# Patient Record
Sex: Female | Born: 1935 | Race: White | Hispanic: No | Marital: Married | Smoking: Never smoker
Health system: Southern US, Community
[De-identification: ages and names within clinical notes are randomized; demographics above are authoritative.]

## PROBLEM LIST (undated history)

## (undated) DIAGNOSIS — E785 Hyperlipidemia, unspecified: Secondary | ICD-10-CM

## (undated) DIAGNOSIS — S52501A Unspecified fracture of the lower end of right radius, initial encounter for closed fracture: Secondary | ICD-10-CM

## (undated) DIAGNOSIS — I639 Cerebral infarction, unspecified: Secondary | ICD-10-CM

## (undated) HISTORY — PX: ABDOMINAL HYSTERECTOMY: SHX81

## (undated) HISTORY — DX: Cerebral infarction, unspecified: I63.9

## (undated) HISTORY — PX: TONSILLECTOMY: SUR1361

## (undated) HISTORY — PX: COLONOSCOPY: SHX174

---

## 2001-10-25 ENCOUNTER — Other Ambulatory Visit: Admission: RE | Admit: 2001-10-25 | Discharge: 2001-10-25 | Payer: Self-pay | Admitting: Obstetrics and Gynecology

## 2002-10-26 ENCOUNTER — Other Ambulatory Visit: Admission: RE | Admit: 2002-10-26 | Discharge: 2002-10-26 | Payer: Self-pay | Admitting: Obstetrics and Gynecology

## 2003-10-30 ENCOUNTER — Other Ambulatory Visit: Admission: RE | Admit: 2003-10-30 | Discharge: 2003-10-30 | Payer: Self-pay | Admitting: Obstetrics and Gynecology

## 2004-11-14 ENCOUNTER — Other Ambulatory Visit: Admission: RE | Admit: 2004-11-14 | Discharge: 2004-11-14 | Payer: Self-pay | Admitting: Obstetrics and Gynecology

## 2006-01-18 ENCOUNTER — Other Ambulatory Visit: Admission: RE | Admit: 2006-01-18 | Discharge: 2006-01-18 | Payer: Self-pay | Admitting: Obstetrics and Gynecology

## 2007-01-25 ENCOUNTER — Other Ambulatory Visit: Admission: RE | Admit: 2007-01-25 | Discharge: 2007-01-25 | Payer: Self-pay | Admitting: Obstetrics and Gynecology

## 2007-06-06 ENCOUNTER — Other Ambulatory Visit: Admission: RE | Admit: 2007-06-06 | Discharge: 2007-06-06 | Payer: Self-pay | Admitting: Obstetrics and Gynecology

## 2009-04-25 ENCOUNTER — Encounter: Admission: RE | Admit: 2009-04-25 | Discharge: 2009-04-25 | Payer: Self-pay | Admitting: Obstetrics and Gynecology

## 2011-12-02 ENCOUNTER — Other Ambulatory Visit: Payer: Self-pay | Admitting: Family Medicine

## 2011-12-02 DIAGNOSIS — Z780 Asymptomatic menopausal state: Secondary | ICD-10-CM

## 2011-12-08 ENCOUNTER — Ambulatory Visit
Admission: RE | Admit: 2011-12-08 | Discharge: 2011-12-08 | Disposition: A | Discharge status: Home / Self Care | Payer: Medicare Other | Source: Ambulatory Visit | Attending: Family Medicine | Admitting: Family Medicine

## 2011-12-08 DIAGNOSIS — Z780 Asymptomatic menopausal state: Secondary | ICD-10-CM

## 2013-02-21 ENCOUNTER — Encounter: Payer: Self-pay | Admitting: *Deleted

## 2013-02-22 ENCOUNTER — Ambulatory Visit (INDEPENDENT_AMBULATORY_CARE_PROVIDER_SITE_OTHER): Payer: Medicare Other | Admitting: *Deleted

## 2013-02-22 DIAGNOSIS — I781 Nevus, non-neoplastic: Secondary | ICD-10-CM

## 2013-02-22 NOTE — Progress Notes (Signed)
X=.3% Sotradecol administered with a 27g butterfly.  Patient received a total of 10cc.  Treated all areas of concern. Easy access and tol well. Will follow prn.  Photos: yes  Compression stockings applied: yes

## 2013-02-23 ENCOUNTER — Encounter: Payer: Self-pay | Admitting: *Deleted

## 2016-01-22 ENCOUNTER — Other Ambulatory Visit: Payer: Self-pay | Admitting: Obstetrics & Gynecology

## 2016-01-22 DIAGNOSIS — E2839 Other primary ovarian failure: Secondary | ICD-10-CM

## 2016-02-06 ENCOUNTER — Ambulatory Visit
Admission: RE | Admit: 2016-02-06 | Discharge: 2016-02-06 | Disposition: A | Discharge status: Home / Self Care | Payer: Medicare Other | Source: Ambulatory Visit | Attending: Obstetrics & Gynecology | Admitting: Obstetrics & Gynecology

## 2016-02-06 DIAGNOSIS — E2839 Other primary ovarian failure: Secondary | ICD-10-CM

## 2016-03-03 ENCOUNTER — Other Ambulatory Visit: Payer: Self-pay | Admitting: Obstetrics & Gynecology

## 2019-06-12 ENCOUNTER — Other Ambulatory Visit: Payer: Self-pay | Admitting: Surgery

## 2019-06-12 DIAGNOSIS — R1011 Right upper quadrant pain: Secondary | ICD-10-CM

## 2021-01-18 ENCOUNTER — Other Ambulatory Visit: Payer: Self-pay

## 2021-01-18 ENCOUNTER — Encounter (HOSPITAL_COMMUNITY): Payer: Self-pay

## 2021-01-18 ENCOUNTER — Emergency Department (HOSPITAL_COMMUNITY): Payer: Medicare Other

## 2021-01-18 ENCOUNTER — Emergency Department (HOSPITAL_COMMUNITY)
Admission: EM | Admit: 2021-01-18 | Discharge: 2021-01-18 | Disposition: A | Discharge status: Home / Self Care | Payer: Medicare Other | Attending: Emergency Medicine | Admitting: Emergency Medicine

## 2021-01-18 DIAGNOSIS — S52501A Unspecified fracture of the lower end of right radius, initial encounter for closed fracture: Secondary | ICD-10-CM | POA: Diagnosis not present

## 2021-01-18 DIAGNOSIS — S6991XA Unspecified injury of right wrist, hand and finger(s), initial encounter: Secondary | ICD-10-CM | POA: Diagnosis present

## 2021-01-18 DIAGNOSIS — Y9367 Activity, basketball: Secondary | ICD-10-CM | POA: Insufficient documentation

## 2021-01-18 DIAGNOSIS — W010XXA Fall on same level from slipping, tripping and stumbling without subsequent striking against object, initial encounter: Secondary | ICD-10-CM | POA: Diagnosis not present

## 2021-01-18 DIAGNOSIS — Y92310 Basketball court as the place of occurrence of the external cause: Secondary | ICD-10-CM | POA: Insufficient documentation

## 2021-01-18 DIAGNOSIS — T148XXA Other injury of unspecified body region, initial encounter: Secondary | ICD-10-CM

## 2021-01-18 MED ORDER — LIDOCAINE HCL (PF) 1 % IJ SOLN
30.0000 mL | Freq: Once | INTRAMUSCULAR | Status: AC
  Administered 2021-01-18: 30 mL
  Filled 2021-01-18: qty 30

## 2021-01-18 MED ORDER — ACETAMINOPHEN-CODEINE #3 300-30 MG PO TABS: 1.0000 | ORAL_TABLET | Freq: Four times a day (QID) | ORAL | 0 refills | Status: AC | PRN

## 2021-01-18 MED ORDER — ACETAMINOPHEN-CODEINE #3 300-30 MG PO TABS
1.0000 | ORAL_TABLET | Freq: Once | ORAL | Status: AC
  Administered 2021-01-18: 1 via ORAL
  Filled 2021-01-18: qty 1

## 2021-01-18 NOTE — Discharge Instructions (Addendum)
I have prescribed medication to help treat the pain for your distal fracture.  Please be aware this medication can cause drowsiness, do not drink alcohol or drive while taking this medication.  Dr. Hinda Glatter phone number is attached to your chart, please schedule an appointment in order to be seen in office of this upcoming week.

## 2021-01-18 NOTE — ED Triage Notes (Signed)
Pt reports falling within the last hour. Pt now endorses right wrist pain. Pt denies hitting her head and taking blood thinners.

## 2021-01-18 NOTE — Progress Notes (Signed)
Orthopedic Tech Progress Note Patient Details:  Katrina Rios 1935/11/06 [id number] Volar was placed after reduction followed up by a sugartong splint and a sling  Ortho Devices Type of Ortho Device: Volar splint,Sugartong splint,Arm sling Ortho Device/Splint Location: Right Arm Ortho Device/Splint Interventions: Application   Post Interventions Patient Tolerated: Well Instructions Provided: Care of device   [name] [name] 01/18/2021, 6:15 PM

## 2021-01-18 NOTE — ED Provider Notes (Signed)
[facility]-EMERGENCY DEPT Provider Note   CSN: [id number] Arrival date & time: 01/18/21  1518     History Chief Complaint  Patient presents with  . Wrist Pain    Katrina Rios is a 85 y.o. female.  85 y.o female with no PMH presents to the ED with a chief complaint of right wrist pain status post mechanical fall.  Patient reports she was playing basketball with her grandson when she suddenly tripped on her own feet, landing on the lateral aspect of her right wrist.  Reports pain to the area, has been unable to fully range her wrist since.  Has taken some ibuprofen for pain control without much improvement.  She is currently not on any blood thinners.  Obvious deformity noted to the right wrist.  No other injury or complaint on today's visit.  The history is provided by the patient.       History reviewed. No pertinent past medical history.  There are no problems to display for this patient.   History reviewed. No pertinent surgical history.   OB History   No obstetric history on file.     History reviewed. No pertinent family history.     Home Medications Prior to Admission medications   Medication Sig Start Date End Date Taking? Authorizing Provider  acetaminophen-codeine (TYLENOL #3) 300-30 MG tablet Take 1 tablet by mouth every 6 (six) hours as needed for up to 3 days for moderate pain. 01/18/21 01/21/21 Yes Lowana Hable, Leonie Douglas, PA-C    Allergies    Cephalosporins  Review of Systems   Review of Systems  Constitutional: Negative for fever.  Musculoskeletal: Positive for arthralgias and myalgias.    Physical Exam Updated Vital Signs BP (!) 137/100 (BP Location: Left Arm)   Pulse 74   Temp 98 F (36.7 C) (Oral)   Resp 16   Ht 5\' 8"  (1.727 m)   Wt 57.6 kg   SpO2 100%   BMI 19.31 kg/m   Physical Exam Vitals and nursing note reviewed.  Constitutional:      Appearance: Normal appearance.  HENT:     Head: Normocephalic and atraumatic.      Nose: Nose normal.  Eyes:     Pupils: Pupils are equal, round, and reactive to light.  Cardiovascular:     Rate and Rhythm: Normal rate.  Pulmonary:     Effort: Pulmonary effort is normal.  Musculoskeletal:        General: Tenderness and deformity present.     Right wrist: Swelling, deformity, tenderness and bony tenderness present. No effusion, lacerations, snuff box tenderness or crepitus. Decreased range of motion. Normal pulse.     Cervical back: Normal range of motion and neck supple.     Comments: Obvious deformity.  Pulses present, capillary refill is intact. Limited ROM with flexion or extension.   Skin:    General: Skin is warm and dry.  Neurological:     Mental Status: She is alert and oriented to person, place, and time.     ED Results / Procedures / Treatments   Labs (all labs ordered are listed, but only abnormal results are displayed) Labs Reviewed - No data to display  EKG None  Radiology DG Wrist 2 Views Right  Result Date: 01/18/2021 CLINICAL DATA:  Status post reduction of right wrist fracture. EXAM: RIGHT WRIST - 2 VIEW COMPARISON:  Same day radiograph. FINDINGS: Splinting material obscures fine bony and soft tissue detail. Interval reduction of the comminuted fracture  of the distal radius with intra-articular extension, now with decreased impaction and posterior angulation of the distal fracture fragment. IMPRESSION: Status post reduction and splinting with slightly improved alignment of the comminuted impacted fracture of the distal radius. Electronically Signed   By: Maudry Mayhew MD   On: 01/18/2021 18:13   DG Wrist Complete Right  Result Date: 01/18/2021 CLINICAL DATA:  Fall while playing basketball with wrist pain. EXAM: RIGHT WRIST - COMPLETE 3+ VIEW COMPARISON:  None. FINDINGS: There is a comminuted, impacted fracture of the distal radius which involves the radiocarpal joint space. Degenerative changes are seen at the first carpal metacarpal joint.  There is surrounding soft tissue swelling. No radiopaque foreign body is identified. IMPRESSION: Comminuted, impacted fracture of the distal radius. Electronically Signed   By: Romona Curls M.D.   On: 01/18/2021 16:13    Procedures .Ortho Injury Treatment  Date/Time: 01/18/2021 6:08 PM Performed by: Claude Manges, PA-C Authorized by: Claude Manges, PA-C   Consent:    Consent obtained:  Verbal   Consent given by:  Patient   Risks discussed:  Fracture Universal protocol:    Patient identity confirmed:  Verbally with patientInjury location: wrist Location details: right wrist Injury type: fracture Fracture type: distal radius Pre-procedure neurovascular assessment: neurovascularly intact Anesthesia: hematoma block  Anesthesia: Local anesthesia used: yes Local Anesthetic: lidocaine 1% without epinephrine Anesthetic total: 5 mL  Patient sedated: NoImmobilization: splint Splint type: volar short arm and sugar tong Splint Applied by: ED Tech Supplies used: cotton padding Post-procedure neurovascular assessment: post-procedure neurovascularly intact      Medications Ordered in ED Medications  acetaminophen-codeine (TYLENOL #3) 300-30 MG per tablet 1 tablet (1 tablet Oral Given 01/18/21 1608)  lidocaine (PF) (XYLOCAINE) 1 % injection 30 mL (30 mLs Infiltration Given by Other 01/18/21 1811)    ED Course  I have reviewed the triage vital signs and the nursing notes.  Pertinent labs & imaging results that were available during my care of the patient were reviewed by me and considered in my medical decision making (see chart for details).  Clinical Course as of 01/18/21 1820  Sat Jan 18, 2021  1734 DG Wrist Complete Right [AW]    Clinical Course User Index [AW] Koleen Distance, MD   MDM Rules/Calculators/A&P  Patient presents to the ED status post mechanical fall after playing basketball with her grandson.  Obvious deformity noted to the right wrist.  Limited range of motion  with flexion and extension of the right wrist.  Arrived with stable vital signs.  Took ibuprofen for pain control without much improvement.  Currently not on any blood thinners.  During evaluation pulses are present, capillary refill is intact, obvious deformity noted, bony tenderness at wrist region. Xray ordered.   Xray of the right wrist showed: Comminuted, impacted fracture of the distal radius.  4:26 PM Call placed to Dr. Roney Mans, hand specialist for further recommendations.   4:56 PM spoke to Dr. Roney Mans hand specialist, who recommended tong splint along with outpatient follow-up the upcoming week  Hematoma block was performed by me, Dr. Delford Field performed the reduction at the bedside.  Splint was applied by [employer].  She is neurovascularly intact, we discussed follow-up with hand specialist the upcoming week.  Post reduction x-ray showed: Status post reduction and splinting with slightly improved alignment  of the comminuted impacted fracture of the distal radius.   This was discussed with patient, she was provided with tylenol 3 for pain control. Will need to follow up  with orthopedist outpatient. Patient stable for discharge.    Portions of this note were generated with Scientist, clinical (histocompatibility and immunogenetics). Dictation errors may occur despite best attempts at proofreading.  Final Clinical Impression(s) / ED Diagnoses Final diagnoses:  Closed fracture of distal end of right radius, unspecified fracture morphology, initial encounter    Rx / DC Orders ED Discharge Orders         Ordered    acetaminophen-codeine (TYLENOL #3) 300-30 MG tablet  Every 6 hours PRN        01/18/21 1807           Claude Manges, PA-C 01/18/21 1820    Koleen Distance, MD 01/18/21 2026

## 2021-01-21 ENCOUNTER — Encounter (HOSPITAL_BASED_OUTPATIENT_CLINIC_OR_DEPARTMENT_OTHER): Payer: Self-pay | Admitting: Orthopaedic Surgery

## 2021-01-21 ENCOUNTER — Other Ambulatory Visit: Payer: Self-pay

## 2021-01-23 ENCOUNTER — Other Ambulatory Visit (HOSPITAL_COMMUNITY): Payer: Medicare Other

## 2021-01-24 ENCOUNTER — Encounter (HOSPITAL_BASED_OUTPATIENT_CLINIC_OR_DEPARTMENT_OTHER)
Admission: RE | Admit: 2021-01-24 | Discharge: 2021-01-24 | Disposition: A | Discharge status: Home / Self Care | Payer: Medicare Other | Source: Ambulatory Visit | Attending: Orthopaedic Surgery | Admitting: Orthopaedic Surgery

## 2021-01-24 DIAGNOSIS — S52571A Other intraarticular fracture of lower end of right radius, initial encounter for closed fracture: Secondary | ICD-10-CM | POA: Diagnosis not present

## 2021-01-24 DIAGNOSIS — W010XXA Fall on same level from slipping, tripping and stumbling without subsequent striking against object, initial encounter: Secondary | ICD-10-CM | POA: Diagnosis not present

## 2021-01-24 DIAGNOSIS — Z20822 Contact with and (suspected) exposure to covid-19: Secondary | ICD-10-CM | POA: Diagnosis not present

## 2021-01-24 DIAGNOSIS — Z79890 Hormone replacement therapy (postmenopausal): Secondary | ICD-10-CM | POA: Diagnosis not present

## 2021-01-24 DIAGNOSIS — Z881 Allergy status to other antibiotic agents status: Secondary | ICD-10-CM | POA: Diagnosis not present

## 2021-01-24 DIAGNOSIS — Y9367 Activity, basketball: Secondary | ICD-10-CM | POA: Diagnosis not present

## 2021-01-24 LAB — SARS CORONAVIRUS 2 (TAT 6-24 HRS): SARS Coronavirus 2: NEGATIVE

## 2021-01-24 LAB — SURGICAL PCR SCREEN
MRSA, PCR: NEGATIVE
Staphylococcus aureus: NEGATIVE

## 2021-01-24 NOTE — Progress Notes (Signed)

## 2021-01-27 ENCOUNTER — Ambulatory Visit (HOSPITAL_BASED_OUTPATIENT_CLINIC_OR_DEPARTMENT_OTHER)
Admission: RE | Admit: 2021-01-27 | Discharge: 2021-01-27 | Disposition: A | Discharge status: Home / Self Care | Payer: Medicare Other | Attending: Orthopaedic Surgery | Admitting: Orthopaedic Surgery

## 2021-01-27 ENCOUNTER — Encounter (HOSPITAL_BASED_OUTPATIENT_CLINIC_OR_DEPARTMENT_OTHER): Payer: Self-pay | Admitting: Orthopaedic Surgery

## 2021-01-27 ENCOUNTER — Ambulatory Visit (HOSPITAL_BASED_OUTPATIENT_CLINIC_OR_DEPARTMENT_OTHER): Payer: Medicare Other | Admitting: Anesthesiology

## 2021-01-27 ENCOUNTER — Encounter (HOSPITAL_BASED_OUTPATIENT_CLINIC_OR_DEPARTMENT_OTHER)
Admission: RE | Disposition: A | Discharge status: Home / Self Care | Payer: Self-pay | Source: Home / Self Care | Attending: Orthopaedic Surgery

## 2021-01-27 ENCOUNTER — Other Ambulatory Visit: Payer: Self-pay

## 2021-01-27 DIAGNOSIS — Z881 Allergy status to other antibiotic agents status: Secondary | ICD-10-CM | POA: Insufficient documentation

## 2021-01-27 DIAGNOSIS — W010XXA Fall on same level from slipping, tripping and stumbling without subsequent striking against object, initial encounter: Secondary | ICD-10-CM | POA: Insufficient documentation

## 2021-01-27 DIAGNOSIS — S52571A Other intraarticular fracture of lower end of right radius, initial encounter for closed fracture: Secondary | ICD-10-CM | POA: Diagnosis not present

## 2021-01-27 DIAGNOSIS — Y9367 Activity, basketball: Secondary | ICD-10-CM | POA: Insufficient documentation

## 2021-01-27 DIAGNOSIS — Z79890 Hormone replacement therapy (postmenopausal): Secondary | ICD-10-CM | POA: Insufficient documentation

## 2021-01-27 DIAGNOSIS — Z20822 Contact with and (suspected) exposure to covid-19: Secondary | ICD-10-CM | POA: Diagnosis not present

## 2021-01-27 HISTORY — PX: OPEN REDUCTION INTERNAL FIXATION (ORIF) DISTAL RADIAL FRACTURE: SHX5989

## 2021-01-27 HISTORY — DX: Unspecified fracture of the lower end of right radius, initial encounter for closed fracture: S52.501A

## 2021-01-27 SURGERY — OPEN REDUCTION INTERNAL FIXATION (ORIF) DISTAL RADIUS FRACTURE
Anesthesia: Monitor Anesthesia Care | Site: Wrist | Laterality: Right

## 2021-01-27 MED ORDER — VANCOMYCIN HCL IN DEXTROSE 1-5 GM/200ML-% IV SOLN
1000.0000 mg | INTRAVENOUS | Status: AC
  Administered 2021-01-27: 1000 mg via INTRAVENOUS

## 2021-01-27 MED ORDER — ONDANSETRON HCL 4 MG/2ML IJ SOLN
INTRAMUSCULAR | Status: AC
  Filled 2021-01-27: qty 2

## 2021-01-27 MED ORDER — HYDROCODONE-ACETAMINOPHEN 5-325 MG PO TABS: 1.0000 | ORAL_TABLET | Freq: Four times a day (QID) | ORAL | 0 refills | Status: AC | PRN

## 2021-01-27 MED ORDER — ONDANSETRON HCL 4 MG/2ML IJ SOLN
INTRAMUSCULAR | Status: DC | PRN
  Administered 2021-01-27: 4 mg via INTRAVENOUS

## 2021-01-27 MED ORDER — VANCOMYCIN HCL IN DEXTROSE 1-5 GM/200ML-% IV SOLN
INTRAVENOUS | Status: AC
  Filled 2021-01-27: qty 200

## 2021-01-27 MED ORDER — ROPIVACAINE HCL 5 MG/ML IJ SOLN
INTRAMUSCULAR | Status: DC | PRN
  Administered 2021-01-27: 20 mL via PERINEURAL

## 2021-01-27 MED ORDER — ACETAMINOPHEN 500 MG PO TABS
ORAL_TABLET | ORAL | Status: AC
  Filled 2021-01-27: qty 2

## 2021-01-27 MED ORDER — DEXAMETHASONE SODIUM PHOSPHATE 10 MG/ML IJ SOLN
INTRAMUSCULAR | Status: DC | PRN
  Administered 2021-01-27: 5 mg

## 2021-01-27 MED ORDER — FENTANYL CITRATE (PF) 100 MCG/2ML IJ SOLN
100.0000 ug | Freq: Once | INTRAMUSCULAR | Status: AC
  Administered 2021-01-27: 100 ug via INTRAVENOUS

## 2021-01-27 MED ORDER — PROPOFOL 10 MG/ML IV BOLUS
INTRAVENOUS | Status: AC
  Filled 2021-01-27: qty 20

## 2021-01-27 MED ORDER — FENTANYL CITRATE (PF) 100 MCG/2ML IJ SOLN
INTRAMUSCULAR | Status: AC
  Filled 2021-01-27: qty 2

## 2021-01-27 MED ORDER — ACETAMINOPHEN 500 MG PO TABS
1000.0000 mg | ORAL_TABLET | Freq: Once | ORAL | Status: AC
  Administered 2021-01-27: 1000 mg via ORAL

## 2021-01-27 MED ORDER — CHLORHEXIDINE GLUCONATE 4 % EX LIQD: 60.0000 mL | Freq: Once | CUTANEOUS | Status: DC

## 2021-01-27 MED ORDER — PROPOFOL 500 MG/50ML IV EMUL
INTRAVENOUS | Status: DC | PRN
  Administered 2021-01-27: 75 ug/kg/min via INTRAVENOUS

## 2021-01-27 MED ORDER — FENTANYL CITRATE (PF) 100 MCG/2ML IJ SOLN: 25.0000 ug | INTRAMUSCULAR | Status: DC | PRN

## 2021-01-27 MED ORDER — LACTATED RINGERS IV SOLN: INTRAVENOUS | Status: DC

## 2021-01-27 MED ORDER — POVIDONE-IODINE 10 % EX SWAB
2.0000 "application " | Freq: Once | CUTANEOUS | Status: AC
  Administered 2021-01-27: 2 via TOPICAL

## 2021-01-27 MED ORDER — PROPOFOL 500 MG/50ML IV EMUL
INTRAVENOUS | Status: AC
  Filled 2021-01-27: qty 50

## 2021-01-27 MED ORDER — MIDAZOLAM HCL 2 MG/2ML IJ SOLN
INTRAMUSCULAR | Status: AC
  Filled 2021-01-27: qty 2

## 2021-01-27 SURGICAL SUPPLY — 75 items
APL PRP STRL LF DISP 70% ISPRP (MISCELLANEOUS) ×1
BIT DRILL 2.0 LNG QUCK RELEASE (BIT) IMPLANT
BIT DRILL QC 2.8X5 (BIT) ×1 IMPLANT
BLADE SURG 15 STRL LF DISP TIS (BLADE) ×2 IMPLANT
BLADE SURG 15 STRL SS (BLADE) ×4
BNDG CMPR 9X4 STRL LF SNTH (GAUZE/BANDAGES/DRESSINGS) ×1
BNDG ELASTIC 3X5.8 VLCR STR LF (GAUZE/BANDAGES/DRESSINGS) ×2 IMPLANT
BNDG ELASTIC 4X5.8 VLCR STR LF (GAUZE/BANDAGES/DRESSINGS) ×2 IMPLANT
BNDG ESMARK 4X9 LF (GAUZE/BANDAGES/DRESSINGS) ×2 IMPLANT
BNDG GAUZE ELAST 4 BULKY (GAUZE/BANDAGES/DRESSINGS) ×2 IMPLANT
CANISTER SUCT 1200ML W/VALVE (MISCELLANEOUS) ×2 IMPLANT
CHLORAPREP W/TINT 26 (MISCELLANEOUS) ×2 IMPLANT
CORD BIPOLAR FORCEPS 12FT (ELECTRODE) ×2 IMPLANT
COVER BACK TABLE 60X90IN (DRAPES) ×2 IMPLANT
CUFF TOURN SGL QUICK 18X4 (TOURNIQUET CUFF) ×1 IMPLANT
DRAPE EXTREMITY T 121X128X90 (DISPOSABLE) ×2 IMPLANT
DRAPE OEC MINIVIEW 54X84 (DRAPES) ×2 IMPLANT
DRAPE SURG 17X23 STRL (DRAPES) ×2 IMPLANT
DRILL 2.0 LNG QUICK RELEASE (BIT) ×2
GAUZE SPONGE 4X4 12PLY STRL (GAUZE/BANDAGES/DRESSINGS) ×2 IMPLANT
GAUZE XEROFORM 1X8 LF (GAUZE/BANDAGES/DRESSINGS) ×1 IMPLANT
GLOVE SRG 8 PF TXTR STRL LF DI (GLOVE) ×1 IMPLANT
GLOVE SURG ENC MOIS LTX SZ6.5 (GLOVE) ×2 IMPLANT
GLOVE SURG SYN 7.5  E (GLOVE) ×2
GLOVE SURG SYN 7.5 E (GLOVE) ×1 IMPLANT
GLOVE SURG SYN 7.5 PF PI (GLOVE) IMPLANT
GLOVE SURG UNDER POLY LF SZ7 (GLOVE) ×2 IMPLANT
GLOVE SURG UNDER POLY LF SZ8 (GLOVE) ×2
GOWN STRL REUS W/ TWL LRG LVL3 (GOWN DISPOSABLE) ×1 IMPLANT
GOWN STRL REUS W/ TWL XL LVL3 (GOWN DISPOSABLE) ×1 IMPLANT
GOWN STRL REUS W/TWL LRG LVL3 (GOWN DISPOSABLE) ×2
GOWN STRL REUS W/TWL XL LVL3 (GOWN DISPOSABLE) ×2
GUIDEWIRE ORTHO 0.054X6 (WIRE) ×4 IMPLANT
NS IRRIG 1000ML POUR BTL (IV SOLUTION) ×2 IMPLANT
PACK BASIN DAY SURGERY FS (CUSTOM PROCEDURE TRAY) ×2 IMPLANT
PAD CAST 3X4 CTTN HI CHSV (CAST SUPPLIES) ×2 IMPLANT
PAD CAST 4YDX4 CTTN HI CHSV (CAST SUPPLIES) ×1 IMPLANT
PADDING CAST ABS 3INX4YD NS (CAST SUPPLIES) ×1
PADDING CAST ABS 4INX4YD NS (CAST SUPPLIES) ×1
PADDING CAST ABS COTTON 3X4 (CAST SUPPLIES) ×1 IMPLANT
PADDING CAST ABS COTTON 4X4 ST (CAST SUPPLIES) ×1 IMPLANT
PADDING CAST COTTON 3X4 STRL (CAST SUPPLIES) ×4
PADDING CAST COTTON 4X4 STRL (CAST SUPPLIES) ×2
PASSER SUT SWANSON 36MM LOOP (INSTRUMENTS) ×1 IMPLANT
PLATE ACULOC 2 NARROW LONG (Plate) ×1 IMPLANT
SCREW BN FT 16X2.3XLCK HEX CRT (Screw) IMPLANT
SCREW CORT FT 18X2.3XLCK HEX (Screw) IMPLANT
SCREW CORT FT 22X2.3XLCK HEX (Screw) IMPLANT
SCREW CORTICAL LOCKING 2.3X16M (Screw) ×2 IMPLANT
SCREW CORTICAL LOCKING 2.3X18M (Screw) ×8 IMPLANT
SCREW CORTICAL LOCKING 2.3X22M (Screw) ×2 IMPLANT
SCREW HEXALOBE LOCKING 3.5X14M (Screw) ×1 IMPLANT
SCREW LOCK 12X3.5X HEXALOBE (Screw) IMPLANT
SCREW LOCKING 3.5X12 (Screw) ×2 IMPLANT
SCREW NONLOCK HEX 3.5X12 (Screw) ×1 IMPLANT
SHEET MEDIUM DRAPE 40X70 STRL (DRAPES) ×2 IMPLANT
SLING ARM FOAM STRAP LRG (SOFTGOODS) ×1 IMPLANT
SPLINT FIBERGLASS 3X35 (CAST SUPPLIES) ×1 IMPLANT
SPLINT FIBERGLASS 4X30 (CAST SUPPLIES) IMPLANT
SPLINT PLASTER CAST XFAST 3X15 (CAST SUPPLIES) IMPLANT
SPLINT PLASTER XTRA FASTSET 3X (CAST SUPPLIES)
STOCKINETTE SYNTHETIC 3 UNSTER (CAST SUPPLIES) ×2 IMPLANT
SUCTION FRAZIER HANDLE 10FR (MISCELLANEOUS) ×2
SUCTION TUBE FRAZIER 10FR DISP (MISCELLANEOUS) ×1 IMPLANT
SUT ETHIBOND 3-0 V-5 (SUTURE) IMPLANT
SUT FIBERWIRE 2-0 18 17.9 3/8 (SUTURE) ×2
SUT PROLENE 4 0 PS 2 18 (SUTURE) ×3 IMPLANT
SUT VIC AB 3-0 FS2 27 (SUTURE) ×1 IMPLANT
SUTURE FIBERWR 2-0 18 17.9 3/8 (SUTURE) IMPLANT
SYR BULB EAR ULCER 3OZ GRN STR (SYRINGE) ×2 IMPLANT
TAPE SURG TRANSPORE 1 IN (GAUZE/BANDAGES/DRESSINGS) ×1 IMPLANT
TAPE SURGICAL TRANSPORE 1 IN (GAUZE/BANDAGES/DRESSINGS) ×2
TOWEL GREEN STERILE FF (TOWEL DISPOSABLE) ×4 IMPLANT
TUBE CONNECTING 20X1/4 (TUBING) ×2 IMPLANT
UNDERPAD 30X36 HEAVY ABSORB (UNDERPADS AND DIAPERS) ×2 IMPLANT

## 2021-01-27 NOTE — Anesthesia Postprocedure Evaluation (Signed)
Anesthesia Post Note  Patient: TAMZIN BERTLING  Procedure(s) Performed: OPEN REDUCTION INTERNAL FIXATION (ORIF) DISTAL RADIAL FRACTURE (Right Wrist)     Patient location during evaluation: PACU Anesthesia Type: Regional and MAC Level of consciousness: awake and alert Pain management: pain level controlled Vital Signs Assessment: post-procedure vital signs reviewed and stable Respiratory status: spontaneous breathing, nonlabored ventilation, respiratory function stable and patient connected to nasal cannula oxygen Cardiovascular status: blood pressure returned to baseline and stable Postop Assessment: no apparent nausea or vomiting Anesthetic complications: no   No complications documented.  Last Vitals:  Vitals:   01/27/21 1245 01/27/21 1327  BP:  (!) 141/97  Pulse: 66 66  Resp: 20 16  Temp:  (!) 36.3 C  SpO2: 95% 99%    Last Pain:  Vitals:   01/27/21 1304  TempSrc:   PainSc: 0-No pain                 Ruth Tully L Adom Schoeneck

## 2021-01-27 NOTE — H&P (Signed)
ORTHOPAEDIC H&P  PCP:  Kaleen Mask, MD  Chief Complaint: Right wrist pain  HPI: Katrina Rios is a 85 y.o. female who complains of right wrist pain.  Last week she was playing basketball with her grandson.  She tripped and fell onto an outstretched right arm.  She immediate pain and deformity right wrist.  She was subsequently seen in the ER where she was found to have a displaced intra-articular right distal radius fracture.  She underwent a reduction in the ER and splint was placed.  She was then seen by me in clinic.  She still had significant dorsal tilt of the articular surface as well as dorsal comminution based on her current alignment and activity level, I did recommend proceeding forward with open reduction and internal fixation of her right distal radius fracture.  She presents today for operative management.  Past Medical History:  Diagnosis Date  . Closed fracture of right distal radius    Past Surgical History:  Procedure Laterality Date  . ABDOMINAL HYSTERECTOMY    . COLONOSCOPY    . TONSILLECTOMY     Social History   Socioeconomic History  . Marital status: Married    Spouse name: Not on file  . Number of children: Not on file  . Years of education: Not on file  . Highest education level: Not on file  Occupational History  . Not on file  Tobacco Use  . Smoking status: Never Smoker  . Smokeless tobacco: Never Used  Substance and Sexual Activity  . Alcohol use: Not Currently  . Drug use: Never  . Sexual activity: Not on file  Other Topics Concern  . Not on file  Social History Narrative  . Not on file   Social Determinants of Health   Financial Resource Strain: Not on file  Food Insecurity: Not on file  Transportation Needs: Not on file  Physical Activity: Not on file  Stress: Not on file  Social Connections: Not on file   History reviewed. No pertinent family history. Allergies  Allergen Reactions  . Cephalosporins Swelling and Rash    Prior to Admission medications   Medication Sig Start Date End Date Taking? Authorizing Provider  b complex vitamins capsule Take 1 capsule by mouth daily.   Yes [provider]  calcium carbonate (OS-CAL - DOSED IN MG OF ELEMENTAL CALCIUM) 1250 (500 Ca) MG tablet Take 1 tablet by mouth.   Yes [provider]  estradiol (ESTRACE) 1 MG tablet Take 1 mg by mouth daily.   Yes [provider]   No results found.  Positive ROS: All other systems have been reviewed and were otherwise negative with the exception of those mentioned in the HPI and as above.  Physical Exam: General: Alert, no acute distress Cardiovascular: No edema Respiratory: No cyanosis, no use of accessory musculature Skin: Fracture blisters seen on the digits but no signs of infection. Psychiatric: Patient is competent for consent with normal mood and affect  MUSCULOSKELETAL: Tenderness palpation around the right distal radius both palmarly and dorsally.  Intact motor to the AIN, PIN and ulnar nerve distributions.  Fingertips are warm well perfused with brisk capillary refill.  Intact sensation throughout all digits.  Assessment: Displaced intra-articular right distal radius fracture  Plan: Plan to proceed forward with open reduction and internal fixation of the right distal radius fracture.  Risk, benefits and alternatives of the procedure were once again discussed with the patient.  These risks include but  are not limited to infection, bleeding, damage to surrounding structures including blood vessels and nerves, pain, stiffness, malunion, nonunion, implant failure and need for additional procedures.  Informed consent was obtained the patient's right arm was marked.  Plan for discharge home postoperatively with follow-up with me in 10 to 14 days.    Ernest Mallick, MD [phone]   01/27/2021 9:20 AM

## 2021-01-27 NOTE — Progress Notes (Signed)
Assisted Dr. Woodrum with right, ultrasound guided, supraclavicular block. Side rails up, monitors on throughout procedure. See vital signs in flow sheet. Tolerated Procedure well. 

## 2021-01-27 NOTE — Transfer of Care (Signed)
Immediate Anesthesia Transfer of Care Note  Patient: Katrina Rios  Procedure(s) Performed: OPEN REDUCTION INTERNAL FIXATION (ORIF) DISTAL RADIAL FRACTURE (Right Wrist)  Patient Location: PACU  Anesthesia Type:MAC and Regional  Level of Consciousness: awake, alert  and oriented  Airway & Oxygen Therapy: Patient Spontanous Breathing and Patient connected to face mask oxygen  Post-op Assessment: Report given to RN and Post -op Vital signs reviewed and stable  Post vital signs: Reviewed and stable  Last Vitals:  Vitals Value Taken Time  BP    Temp    Pulse    Resp    SpO2      Last Pain:  Vitals:   01/27/21 0853  TempSrc: Oral  PainSc: 3       Patients Stated Pain Goal: 5 (16/60/63 0160)  Complications: No complications documented.

## 2021-01-27 NOTE — Anesthesia Preprocedure Evaluation (Signed)
Anesthesia Evaluation  Patient identified by MRN, date of birth, ID band Patient awake    Reviewed: Allergy & Precautions, NPO status , Patient's Chart, lab work & pertinent test results  Airway Mallampati: II  TM Distance: >3 FB Neck ROM: Full    Dental no notable dental hx. (+) Teeth Intact, Dental Advisory Given   Pulmonary neg pulmonary ROS,    Pulmonary exam normal breath sounds clear to auscultation       Cardiovascular negative cardio ROS Normal cardiovascular exam Rhythm:Regular Rate:Normal     Neuro/Psych negative neurological ROS  negative psych ROS   GI/Hepatic negative GI ROS, Neg liver ROS,   Endo/Other  negative endocrine ROS  Renal/GU negative Renal ROS  negative genitourinary   Musculoskeletal negative musculoskeletal ROS (+)   Abdominal   Peds  Hematology negative hematology ROS (+)   Anesthesia Other Findings   Reproductive/Obstetrics                             Anesthesia Physical Anesthesia Plan  ASA: I  Anesthesia Plan: MAC and Regional   Post-op Pain Management:  Regional for Post-op pain   Induction: Intravenous  PONV Risk Score and Plan: 2 and Propofol infusion, Treatment may vary due to age or medical condition, Ondansetron and Dexamethasone  Airway Management Planned: Natural Airway  Additional Equipment:   Intra-op Plan:   Post-operative Plan:   Informed Consent: I have reviewed the patients History and Physical, chart, labs and discussed the procedure including the risks, benefits and alternatives for the proposed anesthesia with the patient or authorized representative who has indicated his/her understanding and acceptance.     Dental advisory given  Plan Discussed with: CRNA  Anesthesia Plan Comments:         Anesthesia Quick Evaluation

## 2021-01-27 NOTE — Discharge Instructions (Signed)
Discharge Instructions  - Keep dressings in place. Do not remove them. - The dressings must stay dry - Take all medication as prescribed. Transition to over the counter pain medication as your pain improves - Keep the hand elevated over the next 48-72 hours to help with pain and swelling - Move all digits not restricted by the dressings regularly to prevent stiffness - Please call to schedule a follow up appointment with Dr. Roney Mans and therapy at [phone] for 10-14 days following surgery - Your pain medication have been sent digitally to your pharmacy  *No tylenol until 3 pm today 01/27/21 (You had 1000 mg at 9:00 AM)  Post Anesthesia Home Care Instructions  Activity: Get plenty of rest for the remainder of the day. A responsible individual must stay with you for 24 hours following the procedure.  For the next 24 hours, DO NOT: -Drive a car -Advertising copywriter -Drink alcoholic beverages -Take any medication unless instructed by your physician -Make any legal decisions or sign important papers.  Meals: Start with liquid foods such as gelatin or soup. Progress to regular foods as tolerated. Avoid greasy, spicy, heavy foods. If nausea and/or vomiting occur, drink only clear liquids until the nausea and/or vomiting subsides. Call your physician if vomiting continues.  Special Instructions/Symptoms: Your throat may feel dry or sore from the anesthesia or the breathing tube placed in your throat during surgery. If this causes discomfort, gargle with warm salt water. The discomfort should disappear within 24 hours.  If you had a scopolamine patch placed behind your ear for the management of post- operative nausea and/or vomiting:  1. The medication in the patch is effective for 72 hours, after which it should be removed.  Wrap patch in a tissue and discard in the trash. Wash hands thoroughly with soap and water. 2. You may remove the patch earlier than 72 hours if you experience  unpleasant side effects which may include dry mouth, dizziness or visual disturbances. 3. Avoid touching the patch. Wash your hands with soap and water after contact with the patch.    Regional Anesthesia Blocks  1. Numbness or the inability to move the "blocked" extremity may last from 3-48 hours after placement. The length of time depends on the medication injected and your individual response to the medication. If the numbness is not going away after 48 hours, call your surgeon.  2. The extremity that is blocked will need to be protected until the numbness is gone and the  Strength has returned. Because you cannot feel it, you will need to take extra care to avoid injury. Because it may be weak, you may have difficulty moving it or using it. You may not know what position it is in without looking at it while the block is in effect.  3. For blocks in the legs and feet, returning to weight bearing and walking needs to be done carefully. You will need to wait until the numbness is entirely gone and the strength has returned. You should be able to move your leg and foot normally before you try and bear weight or walk. You will need someone to be with you when you first try to ensure you do not fall and possibly risk injury.  4. Bruising and tenderness at the needle site are common side effects and will resolve in a few days.  5. Persistent numbness or new problems with movement should be communicated to the surgeon or the [facility] [phone] Katrina Rios  Katrina Rios [phone]).

## 2021-01-27 NOTE — Op Note (Signed)
PREOPERATIVE DIAGNOSIS: Comminuted and intra-articular, greater than 3 part right distal radius fracture  POSTOPERATIVE DIAGNOSIS: Same  ATTENDING PHYSICIAN: Maudry Mayhew. Jeannie Fend, III, MD who was present and scrubbed for the entire case   ASSISTANT SURGEON: None.   ANESTHESIA: Regional with MAC  SURGICAL PROCEDURES: 1. Open reduction and internal fixation of intra-articular, greater than 3 part distal radius fracture 2.  Right brachioradialis tenotomy  SURGICAL INDICATIONS: Patient is an 85 year old female who is very active.  About 1 week ago she tripped and fell onto an outstretched right hand while playing basketball with her grandson.  She had immediate pain and deformity of her right wrist and was seen at the ER where she was found to have a comminuted and displaced intra-articular right distal radius fracture.  She underwent a closed reduction and was sent to see me in clinic.  In clinic she still had a significant loss of articular height with persistent dorsal angulation of the articular surface.  Due to the patient's activity level and the current alignment, I did recommend proceeding forward with surgical fixation of her right distal radius fracture and she presents today for operative management.  FINDING: Multifragment did intra-articular right distal radius fracture with greater than 3 fragments.  Stable fixation was achieved using a [employer] volar locking plate and screw construct with a distally fitting, narrow with volar plate.  DESCRIPTION OF PROCEDURE: Patient was identified in the preoperative holding area where the risk benefits and alternatives of the procedure were once again discussed with the patient.  These risks include but are not limited to infection, bleeding, damage to surrounding structures including blood vessels and nerves, pain, stiffness, malunion, nonunion, loss reduction and need for additional procedures.  Informed consent was obtained at that time the patient's  right hand was marked with a surgical marking pen.  She then underwent a right upper extremity plexus block by anesthesia.  She was then brought to the operative suite where time was performed identify the correct patient operative site.  She was positioned supine on the operative table with her hand outstretched on a hand table.  A tourniquet was placed on the upper arm and preoperative antibiotics were administered.  The patient was then induced under MAC sedation.  The right upper extremity was then prepped and draped in usual sterile fashion.  The limb was exsanguinated and the tourniquet was inflated.  A standard volar FCR approach was made with a longitudinal incision over the FCR tendon.  The sheath over the FCR tendon was incised and the tendon was mobilized ulnarly.  The deeper FCR to tendon sheath was then incised exposing the FPL muscle and tendon.  This was bluntly mobilized ulnarly as well, exposing the pronator quadratus muscle.  This was incised and elevated off the volar distal radius using a wood handle elevator.  This revealed a multi fragmented, comminuted intra-articular right distal radius fracture.  Reduction of the fracture was then performed with mobilization of both the larger radial styloid and lunate fossa fragments.  Multiple K wires were placed to help hold provisional fixation.  In order to aid in mobilization of the radial styloid piece, the brachioradialis tendon was identified and scissors were used to incise the tendon.  This aided in additional mobilization of the radial styloid fragment.  With holding the fracture in a reduced position, K wires were placed through the radial styloid and across the fracture site to help hold provisional fixation.  At this point the narrow width, distally fitting volar  locking plate from [employer] was placed along the volar cortex of the distal radius.  A kickstand technique was utilized to help correct residual dorsal angulation.  While placing  clamps to help compress the plate down to the bone, the distal row of locking screws was placed through the plate in usual standard fashion.  The kickstand was then removed and the plate was reduced down to the volar cortex of the distal radius.  A cortical screw was then placed through the oblong hole in the plate proximal to the fracture site.  Fluoroscopic images were again obtained which showed significant provement in the alignment of the intra-articular fracture with near-anatomic alignment.  Locking screws were placed in the plate proximal to the fracture site.  The 2 radial styloid screws were then placed through the plate distally to further secure the styloid fragment.  Due to the size of the larger lunate fragment, the guide of the plate was removed to allow for freehand, variable angle screw placement through the secondary lunate fragment piece.  A drill was inserted in the plate hole and advanced under fluoroscopic images exiting out the dorsal and ulnar cortex of the distal radius.  This provided secure fixation to the lunate fossa fragment once the locking screw was placed.  Fluoroscopic images were obtained and there was a small, 1 mm step-off of the articular surface between the lunate and radial styloid fragments.  Due to the complex nature of the fracture and preoperative instability, I elected to except this as the overall alignment of the joint in both the AP and lateral planes was well-maintained.  Additionally there is stable fixation of the fracture as well.  To help supplement additional stability, 2-0 FiberWire was passed through the volar capsule and through the plate and tied down to help prevent further instability of both the radial and lunate fossa pieces.  The wrist was then taken through series range of motion.  Under direct visualization all fracture fragments were stable with no obvious motion.  There was no crepitus with wrist range of motion.  The DRUJ was stressed in both  pronation and supination but found to be stable.  The wound was then copiously irrigated with normal saline and skin was closed with interrupted 4-0 Prolene sutures.  Xeroform, 4 x 4's and well-padded volar slab splint was then placed.  The tourniquet was released and the patient had return of brisk capillary refill to all of her digits.  She was awoken from her sedation and taken to the PACU in stable condition.  She tolerated the procedure well and there were no complications.  RADIOGRAPHIC INTERPRETATION: AP, PA, lateral and fossa lateral images of the right wrist were obtained intraoperative under fluoroscopic images.  These show improved alignment of the comminuted, intra-articular distal radius fracture with volar locking plate and screw construct.  All screws are extra-articular and appropriate length.  No new fractures or dislocations are noted.  ESTIMATED BLOOD LOSS: 10 mL  TOURNIQUET TIME: 80 minutes  SPECIMENS: None  POSTOPERATIVE PLAN: The patient will be discharged home and seen back  in the office in approximately 10-12 days for wound check, suture  removal, and then be sent to a therapist for volar wrist splint fabrication.  We will hold on wrist range of motion until 4 weeks postoperatively.  If she progresses well she can begin strengthening at 8 weeks postoperatively.  IMPLANTS: Acumed distally fitting, narrow width volar locking plate and screw construct.

## 2021-01-27 NOTE — Anesthesia Procedure Notes (Signed)
Anesthesia Regional Block: Supraclavicular block   Pre-Anesthetic Checklist: ,, timeout performed, Correct Patient, Correct Site, Correct Laterality, Correct Procedure, Correct Position, site marked, Risks and benefits discussed,  Surgical consent,  Pre-op evaluation,  At surgeon's request and post-op pain management  Laterality: Right  Prep: Maximum Sterile Barrier Precautions used, chloraprep       Needles:  Injection technique: Single-shot  Needle Type: Echogenic Stimulator Needle     Needle Length: 4cm  Needle Gauge: 21     Additional Needles:   Procedures:,,,, ultrasound used (permanent image in chart),,,,  Narrative:  Start time: 01/27/2021 9:30 AM End time: 01/27/2021 9:38 AM Injection made incrementally with aspirations every 5 mL.  Performed by: Personally  Anesthesiologist: Elmer Picker, MD  Additional Notes: Monitors applied. No increased pain on injection. No increased resistance to injection. Injection made in 5cc increments. Good needle visualization. Patient tolerated procedure well.

## 2021-01-28 ENCOUNTER — Encounter (HOSPITAL_BASED_OUTPATIENT_CLINIC_OR_DEPARTMENT_OTHER): Payer: Self-pay | Admitting: Orthopaedic Surgery

## 2021-12-02 IMAGING — CR DG WRIST COMPLETE 3+V*R*
4 series · 4 of 4 positions shown · non-contrast
Comparison: None.

CLINICAL DATA: Fall while playing basketball with wrist pain.

EXAM:
RIGHT WRIST - COMPLETE 3+ VIEW

[x wrist pa right]
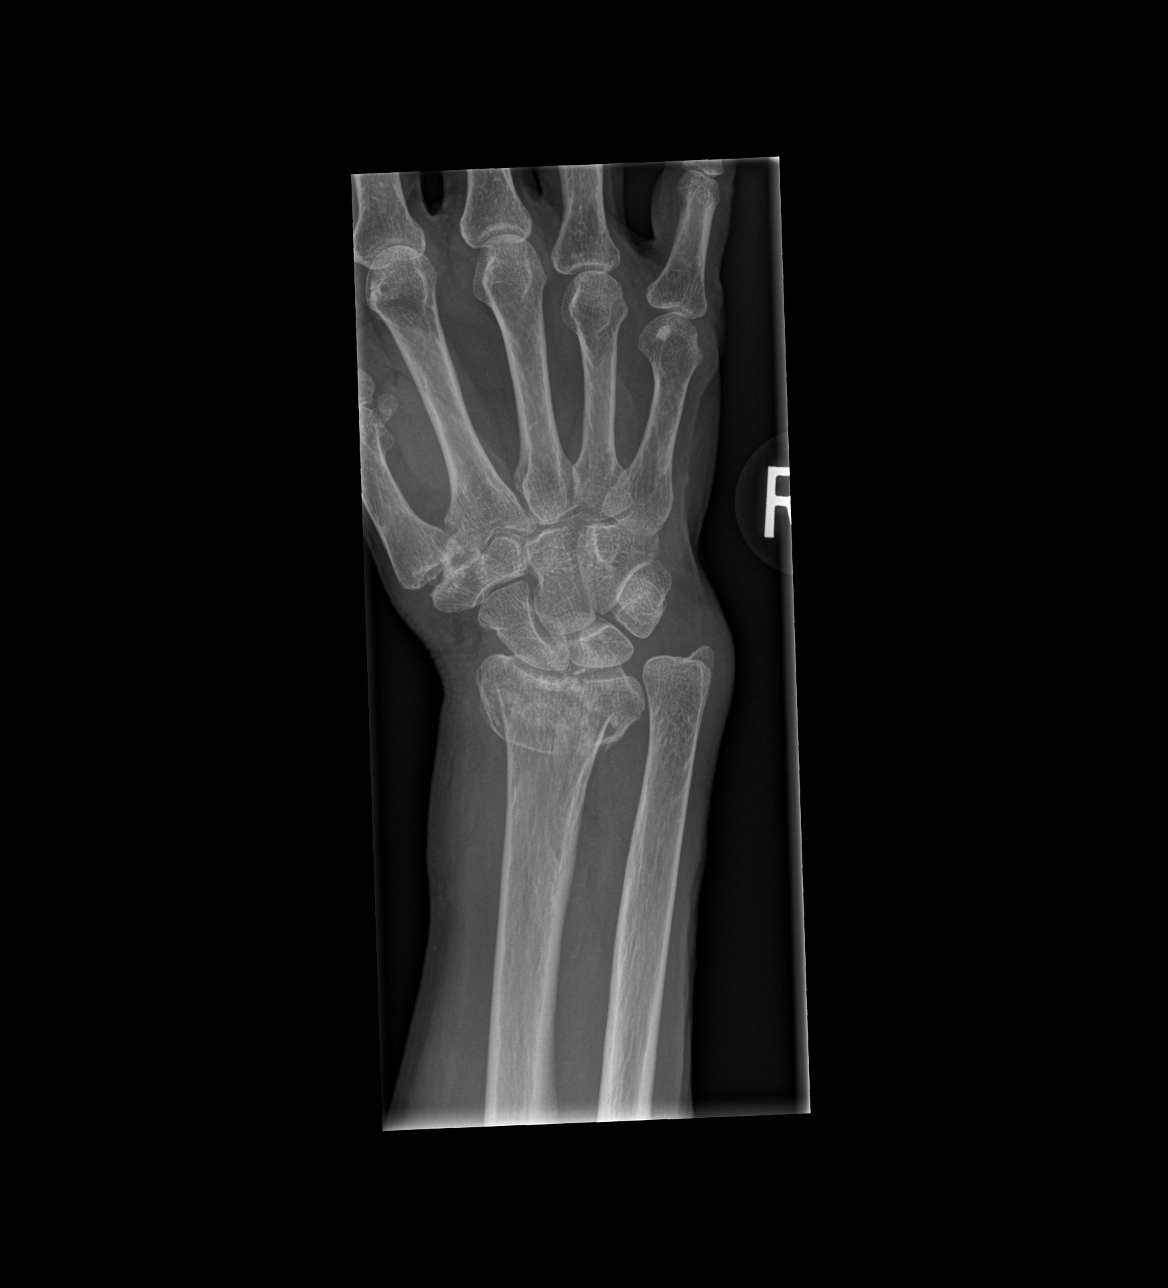

[x wrist obl right]
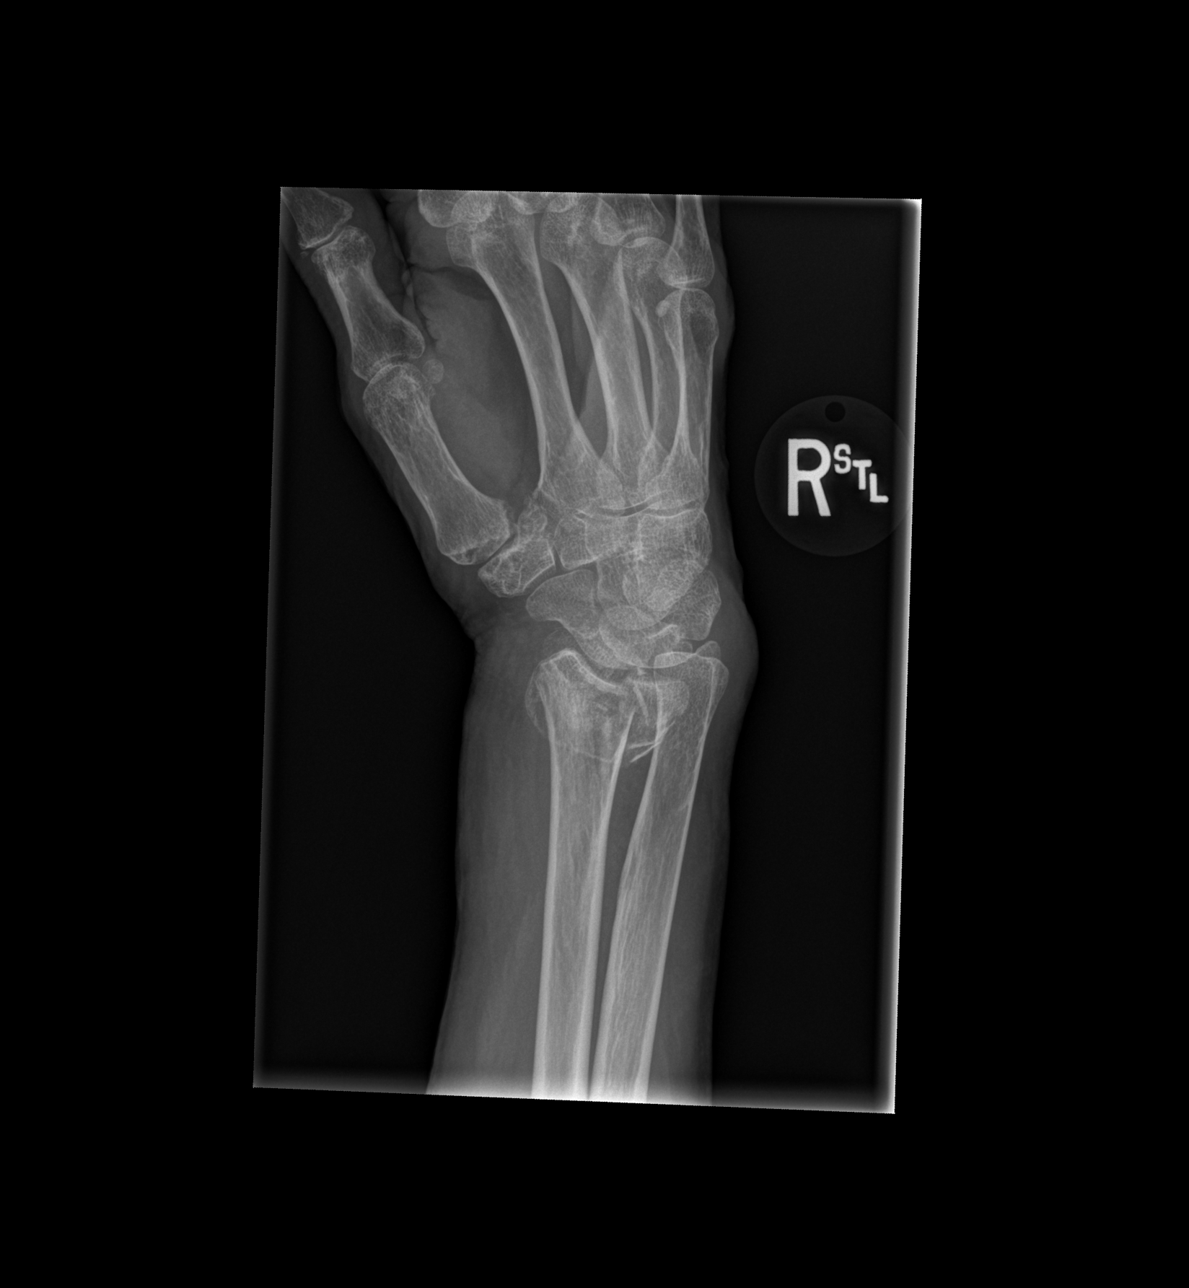

[x wrist lat right]
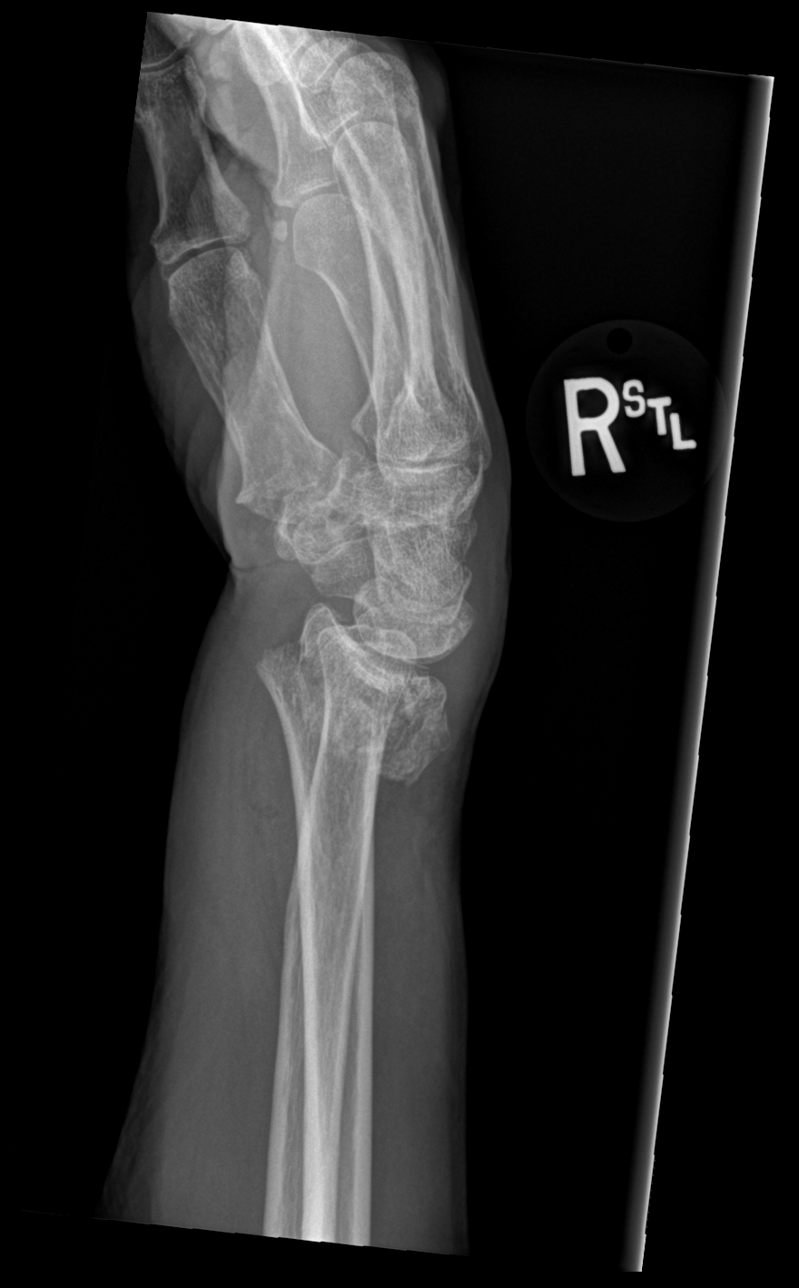

[x wrist navicular view right]
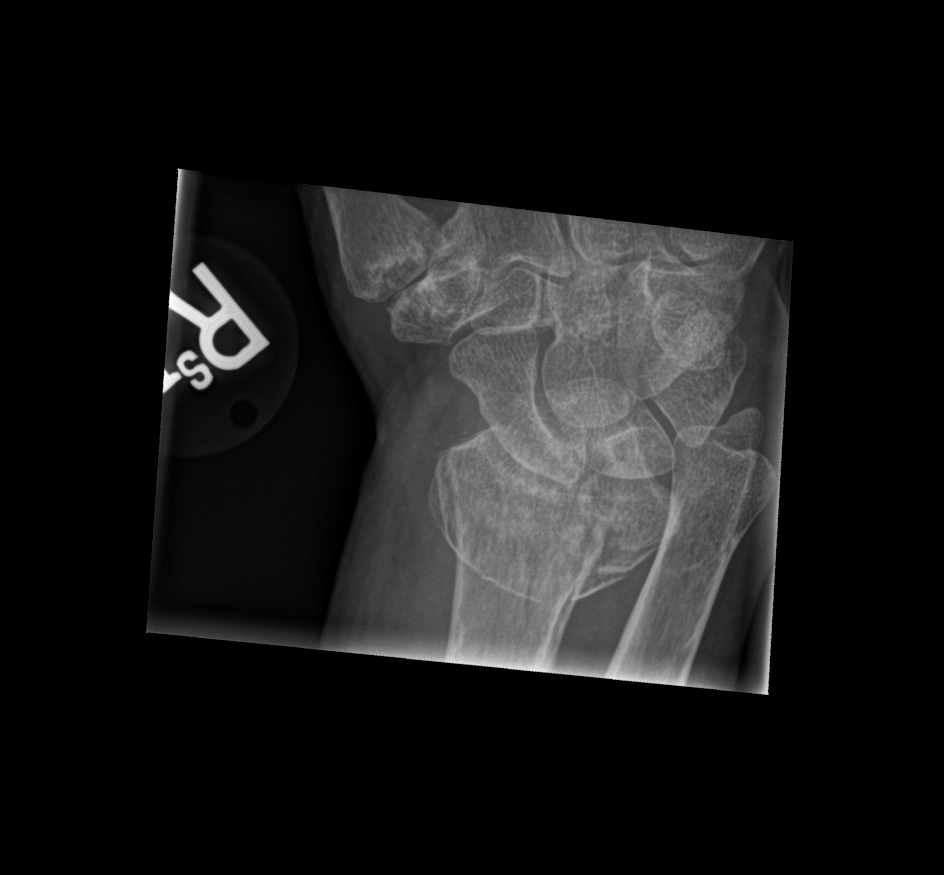

[4 of 4 positions shown; findings below may reference images not displayed]

FINDINGS: There is a comminuted, impacted fracture of the distal radius which
involves the radiocarpal joint space. Degenerative changes are seen
at the first carpal metacarpal joint. There is surrounding soft
tissue swelling. No radiopaque foreign body is identified.
IMPRESSION: Comminuted, impacted fracture of the distal radius.

## 2022-06-22 ENCOUNTER — Encounter (HOSPITAL_COMMUNITY): Payer: Self-pay

## 2022-06-22 ENCOUNTER — Inpatient Hospital Stay (HOSPITAL_COMMUNITY)
Admission: EM | Admit: 2022-06-22 | Discharge: 2022-06-24 | DRG: 065 | Disposition: A | Discharge status: Home / Self Care | Payer: Medicare Other | Attending: Internal Medicine | Admitting: Internal Medicine

## 2022-06-22 ENCOUNTER — Emergency Department (HOSPITAL_COMMUNITY): Payer: Medicare Other

## 2022-06-22 DIAGNOSIS — G459 Transient cerebral ischemic attack, unspecified: Secondary | ICD-10-CM | POA: Diagnosis not present

## 2022-06-22 DIAGNOSIS — R279 Unspecified lack of coordination: Secondary | ICD-10-CM | POA: Diagnosis present

## 2022-06-22 DIAGNOSIS — R7989 Other specified abnormal findings of blood chemistry: Secondary | ICD-10-CM | POA: Diagnosis present

## 2022-06-22 DIAGNOSIS — R29702 NIHSS score 2: Secondary | ICD-10-CM | POA: Diagnosis present

## 2022-06-22 DIAGNOSIS — R471 Dysarthria and anarthria: Secondary | ICD-10-CM | POA: Diagnosis not present

## 2022-06-22 DIAGNOSIS — Z881 Allergy status to other antibiotic agents status: Secondary | ICD-10-CM

## 2022-06-22 DIAGNOSIS — I63511 Cerebral infarction due to unspecified occlusion or stenosis of right middle cerebral artery: Secondary | ICD-10-CM | POA: Diagnosis not present

## 2022-06-22 DIAGNOSIS — E785 Hyperlipidemia, unspecified: Secondary | ICD-10-CM | POA: Diagnosis not present

## 2022-06-22 DIAGNOSIS — R29898 Other symptoms and signs involving the musculoskeletal system: Secondary | ICD-10-CM | POA: Diagnosis present

## 2022-06-22 DIAGNOSIS — N19 Unspecified kidney failure: Secondary | ICD-10-CM | POA: Diagnosis not present

## 2022-06-22 DIAGNOSIS — Z90710 Acquired absence of both cervix and uterus: Secondary | ICD-10-CM

## 2022-06-22 DIAGNOSIS — Z7982 Long term (current) use of aspirin: Secondary | ICD-10-CM

## 2022-06-22 DIAGNOSIS — Z79899 Other long term (current) drug therapy: Secondary | ICD-10-CM

## 2022-06-22 DIAGNOSIS — D649 Anemia, unspecified: Secondary | ICD-10-CM | POA: Diagnosis present

## 2022-06-22 DIAGNOSIS — E860 Dehydration: Secondary | ICD-10-CM | POA: Diagnosis present

## 2022-06-22 DIAGNOSIS — R030 Elevated blood-pressure reading, without diagnosis of hypertension: Secondary | ICD-10-CM | POA: Diagnosis present

## 2022-06-22 DIAGNOSIS — I639 Cerebral infarction, unspecified: Principal | ICD-10-CM

## 2022-06-22 DIAGNOSIS — R29810 Facial weakness: Secondary | ICD-10-CM | POA: Diagnosis present

## 2022-06-22 DIAGNOSIS — N179 Acute kidney failure, unspecified: Secondary | ICD-10-CM | POA: Diagnosis present

## 2022-06-22 HISTORY — DX: Hyperlipidemia, unspecified: E78.5

## 2022-06-22 LAB — COMPREHENSIVE METABOLIC PANEL
ALT: 14 U/L (ref 0–44)
AST: 23 U/L (ref 15–41)
Albumin: 4 g/dL (ref 3.5–5.0)
Alkaline Phosphatase: 62 U/L (ref 38–126)
Anion gap: 7 (ref 5–15)
BUN: 25 mg/dL — ABNORMAL HIGH (ref 8–23)
CO2: 29 mmol/L (ref 22–32)
Calcium: 9.2 mg/dL (ref 8.9–10.3)
Chloride: 101 mmol/L (ref 98–111)
Creatinine, Ser: 1.16 mg/dL — ABNORMAL HIGH (ref 0.44–1.00)
GFR, Estimated: 46 mL/min — ABNORMAL LOW (ref >60)
Glucose, Bld: 101 mg/dL — ABNORMAL HIGH (ref 70–99)
Potassium: 4.8 mmol/L (ref 3.5–5.1)
Sodium: 137 mmol/L (ref 135–145)
Total Bilirubin: 0.5 mg/dL (ref 0.3–1.2)
Total Protein: 7.4 g/dL (ref 6.5–8.1)

## 2022-06-22 LAB — URINALYSIS, ROUTINE W REFLEX MICROSCOPIC
Bilirubin Urine: NEGATIVE
Glucose, UA: NEGATIVE mg/dL
Hgb urine dipstick: NEGATIVE
Ketones, ur: NEGATIVE mg/dL
Leukocytes,Ua: NEGATIVE
Nitrite: NEGATIVE
Protein, ur: NEGATIVE mg/dL
Specific Gravity, Urine: 1.003 — ABNORMAL LOW (ref 1.005–1.030)
pH: 8 (ref 5.0–8.0)

## 2022-06-22 LAB — RAPID URINE DRUG SCREEN, HOSP PERFORMED
Amphetamines: NOT DETECTED
Barbiturates: NOT DETECTED
Benzodiazepines: NOT DETECTED
Cocaine: NOT DETECTED
Opiates: NOT DETECTED
Tetrahydrocannabinol: NOT DETECTED

## 2022-06-22 LAB — CBC
HCT: 37.8 % (ref 36.0–46.0)
Hemoglobin: 11.9 g/dL — ABNORMAL LOW (ref 12.0–15.0)
MCH: 31.1 pg (ref 26.0–34.0)
MCHC: 31.5 g/dL (ref 30.0–36.0)
MCV: 98.7 fL (ref 80.0–100.0)
Platelets: 231 10*3/uL (ref 150–400)
RBC: 3.83 MIL/uL — ABNORMAL LOW (ref 3.87–5.11)
RDW: 12.7 % (ref 11.5–15.5)
WBC: 7.1 10*3/uL (ref 4.0–10.5)
nRBC: 0 % (ref 0.0–0.2)

## 2022-06-22 LAB — I-STAT CHEM 8, ED
BUN: 24 mg/dL — ABNORMAL HIGH (ref 8–23)
Calcium, Ion: 1.17 mmol/L (ref 1.15–1.40)
Chloride: 99 mmol/L (ref 98–111)
Creatinine, Ser: 1.2 mg/dL — ABNORMAL HIGH (ref 0.44–1.00)
Glucose, Bld: 97 mg/dL (ref 70–99)
HCT: 38 % (ref 36.0–46.0)
Hemoglobin: 12.9 g/dL (ref 12.0–15.0)
Potassium: 4.8 mmol/L (ref 3.5–5.1)
Sodium: 137 mmol/L (ref 135–145)
TCO2: 30 mmol/L (ref 22–32)

## 2022-06-22 LAB — DIFFERENTIAL
Abs Immature Granulocytes: 0.01 10*3/uL (ref 0.00–0.07)
Basophils Absolute: 0.1 10*3/uL (ref 0.0–0.1)
Basophils Relative: 1 %
Eosinophils Absolute: 0.1 10*3/uL (ref 0.0–0.5)
Eosinophils Relative: 2 %
Immature Granulocytes: 0 %
Lymphocytes Relative: 46 %
Lymphs Abs: 3.3 10*3/uL (ref 0.7–4.0)
Monocytes Absolute: 0.7 10*3/uL (ref 0.1–1.0)
Monocytes Relative: 10 %
Neutro Abs: 2.9 10*3/uL (ref 1.7–7.7)
Neutrophils Relative %: 41 %

## 2022-06-22 LAB — ETHANOL: Alcohol, Ethyl (B): <10 mg/dL (ref <10)

## 2022-06-22 LAB — APTT: aPTT: 28 seconds (ref 24–36)

## 2022-06-22 LAB — MAGNESIUM: Magnesium: 2.3 mg/dL (ref 1.7–2.4)

## 2022-06-22 LAB — PROTIME-INR
INR: 1 (ref 0.8–1.2)
Prothrombin Time: 12.9 seconds (ref 11.4–15.2)

## 2022-06-22 LAB — CBG MONITORING, ED: Glucose-Capillary: 96 mg/dL (ref 70–99)

## 2022-06-22 MED ORDER — ASPIRIN 81 MG PO CHEW: 81.0000 mg | CHEWABLE_TABLET | Freq: Once | ORAL | Status: DC

## 2022-06-22 MED ORDER — STROKE: EARLY STAGES OF RECOVERY BOOK
Freq: Once | Status: AC
  Filled 2022-06-22: qty 1

## 2022-06-22 MED ORDER — HYDRALAZINE HCL 20 MG/ML IJ SOLN: 10.0000 mg | INTRAMUSCULAR | Status: DC | PRN

## 2022-06-22 MED ORDER — SODIUM CHLORIDE 0.9 % IV SOLN
100.0000 mL/h | INTRAVENOUS | Status: DC
  Administered 2022-06-22: 100 mL/h via INTRAVENOUS

## 2022-06-22 MED ORDER — CLOPIDOGREL BISULFATE 75 MG PO TABS
75.0000 mg | ORAL_TABLET | Freq: Every day | ORAL | Status: DC
  Filled 2022-06-22: qty 1

## 2022-06-22 MED ORDER — SODIUM CHLORIDE 0.9 % IV BOLUS
500.0000 mL | Freq: Once | INTRAVENOUS | Status: AC
  Administered 2022-06-22: 500 mL via INTRAVENOUS

## 2022-06-22 MED ORDER — ACETAMINOPHEN 650 MG RE SUPP: 650.0000 mg | Freq: Four times a day (QID) | RECTAL | Status: DC | PRN

## 2022-06-22 MED ORDER — ACETAMINOPHEN 325 MG PO TABS
650.0000 mg | ORAL_TABLET | Freq: Four times a day (QID) | ORAL | Status: DC | PRN
  Administered 2022-06-24: 650 mg via ORAL
  Filled 2022-06-22: qty 2

## 2022-06-22 NOTE — ED Provider Notes (Signed)
[facility] DEPT Provider Note   CSN: [id number] Arrival date & time: 06/22/22  1715     History  Chief Complaint  Patient presents with  . Extremity Weakness  . Slurred Speech    Katrina Rios is a 86 y.o. female.   Extremity Weakness     Patient does not have a history of any significant medical problems.  She presents to the ED with complaints of difficulty with her speech and weakness of her upper extremity.  Patient states this morning at around 9 AM she had difficulty holding an object with her right hand.  She kept on dropping it.  Initially started on the left hand but then involve the right.  Patient states later in the day about 3 PM she started having trouble with her speech.  Husband states she is having dysarthria.  Patient denies any headache.  No trouble with her legs today.  She did have an episode a couple weeks ago where she felt that her left leg was weak but that all resolved.  Patient did have plans to see her primary care doctor but has not had the appointment yet.  Home Medications Prior to Admission medications   Medication Sig Start Date End Date Taking? Authorizing Provider  b complex vitamins capsule Take 1 capsule by mouth daily.    [provider]  calcium carbonate (OS-CAL - DOSED IN MG OF ELEMENTAL CALCIUM) 1250 (500 Ca) MG tablet Take 1 tablet by mouth.    [provider]  estradiol (ESTRACE) 1 MG tablet Take 1 mg by mouth daily.    [provider]      Allergies    Cephalosporins    Review of Systems   Review of Systems  Constitutional:  Negative for fever.  Musculoskeletal:  Positive for extremity weakness.    Physical Exam Updated Vital Signs BP (!) 190/76   Pulse 67   Temp 97.8 F (36.6 C)   Resp 18   SpO2 97%  Physical Exam Vitals and nursing note reviewed.  Constitutional:      General: She is not in acute distress.    Appearance: She is well-developed.  HENT:      Head: Normocephalic and atraumatic.     Right Ear: External ear normal.     Left Ear: External ear normal.  Eyes:     General: No visual field deficit or scleral icterus.       Right eye: No discharge.        Left eye: No discharge.     Conjunctiva/sclera: Conjunctivae normal.  Neck:     Trachea: No tracheal deviation.  Cardiovascular:     Rate and Rhythm: Normal rate and regular rhythm.  Pulmonary:     Effort: Pulmonary effort is normal. No respiratory distress.     Breath sounds: Normal breath sounds. No stridor. No wheezing or rales.  Abdominal:     General: Bowel sounds are normal. There is no distension.     Palpations: Abdomen is soft.     Tenderness: There is no abdominal tenderness. There is no guarding or rebound.  Musculoskeletal:        General: No tenderness.     Cervical back: Neck supple.  Skin:    General: Skin is warm and dry.     Findings: No rash.  Neurological:     Mental Status: She is alert and oriented to person, place, and time.     Cranial Nerves: No  cranial nerve deficit, dysarthria or facial asymmetry.     Sensory: No sensory deficit.     Motor: No abnormal muscle tone, seizure activity or pronator drift.     Coordination: Coordination normal.     Comments:  able to hold both legs off bed for 5 seconds, sensation intact in all extremities,  no left or right sided neglect, normal finger-nose exam bilaterally, no nystagmus noted   Psychiatric:        Mood and Affect: Mood normal.     ED Results / Procedures / Treatments   Labs (all labs ordered are listed, but only abnormal results are displayed) Labs Reviewed  CBC - Abnormal; Notable for the following components:      Result Value   RBC 3.83 (*)    Hemoglobin 11.9 (*)    All other components within normal limits  COMPREHENSIVE METABOLIC PANEL - Abnormal; Notable for the following components:   Glucose, Bld 101 (*)    BUN 25 (*)    Creatinine, Ser 1.16 (*)    GFR, Estimated 46 (*)    All  other components within normal limits  URINALYSIS, ROUTINE W REFLEX MICROSCOPIC - Abnormal; Notable for the following components:   Color, Urine COLORLESS (*)    Specific Gravity, Urine 1.003 (*)    All other components within normal limits  I-STAT CHEM 8, ED - Abnormal; Notable for the following components:   BUN 24 (*)    Creatinine, Ser 1.20 (*)    All other components within normal limits  ETHANOL  PROTIME-INR  APTT  DIFFERENTIAL  RAPID URINE DRUG SCREEN, HOSP PERFORMED  CBG MONITORING, ED    EKG EKG Interpretation  Date/Time:  Monday June 22 2022 17:40:16 EDT Ventricular Rate:  67 PR Interval:  192 QRS Duration: 80 QT Interval:  443 QTC Calculation: 468 R Axis:   61 Text Interpretation: Sinus rhythm Anteroseptal infarct, age indeterminate Confirmed by Dorie Rank [phone]) on 06/22/2022 5:47:58 PM  Radiology CT HEAD WO CONTRAST  Result Date: 06/22/2022 CLINICAL DATA:  Slurred speech starting at 3:30 p.m., weakness in the left hand. EXAM: CT HEAD WITHOUT CONTRAST TECHNIQUE: Contiguous axial images were obtained from the base of the skull through the vertex without intravenous contrast. RADIATION DOSE REDUCTION: This exam was performed according to the departmental dose-optimization program which includes automated exposure control, adjustment of the mA and/or kV according to patient size and/or use of iterative reconstruction technique. COMPARISON:  None Available. FINDINGS: Brain: The brainstem, cerebellum, cerebral peduncles, thalami, basal ganglia, basilar cisterns, and ventricular system appear within normal limits. Periventricular white matter and corona radiata hypodensities favor chronic ischemic microvascular white matter disease. No intracranial hemorrhage, mass lesion, or acute CVA. Vascular: Unremarkable Skull: Unremarkable Sinuses/Orbits: Small mucous retention cyst in the left maxillary sinus. Other: No supplemental non-categorized findings. IMPRESSION: 1. No acute  intracranial findings. 2. Periventricular white matter and corona radiata hypodensities favor chronic ischemic microvascular white matter disease. 3. Small mucous retention cyst in the left maxillary sinus. Electronically Signed   By: Van Clines M.D.   On: 06/22/2022 18:38    Procedures Procedures    Medications Ordered in ED Medications  sodium chloride 0.9 % bolus 500 mL (0 mLs Intravenous Stopped 06/22/22 1936)    Followed by  0.9 %  sodium chloride infusion (100 mL/hr Intravenous New Bag/Given 06/22/22 1805)    ED Course/ Medical Decision Making/ A&P Clinical Course as of 06/22/22 2259  Mon Jun 22, 2022  2203 No acute changes  on CT. [JK]  2203 Ethanol nl [JK]  2203 Comprehensive metabolic panel(!) nl [JK]  2203 Ethanol nl [JK]  2203 Protime-INR nl [JK]  2210 Discussed case with teleneurology specialist.  Admission to the hospital for full stroke work-up recommended. [JK]  2259 Case discussed with Dr Velia Meyer [JK]    Clinical Course User Index [JK] Dorie Rank, MD                 NIH Stroke Scale: 1          Medical Decision Making Differential diagnosis includes but not limited to stroke, TIA, complex migraine  Problems Addressed: Cerebrovascular accident (CVA), unspecified mechanism ([place]): acute illness or injury that poses a threat to life or bodily functions  Amount and/or Complexity of Data Reviewed Labs: ordered. Decision-making details documented in ED Course. Radiology: ordered. Discussion of management or test interpretation with external provider(s): Case discussed with neuro hospitalist service.  Recommendation is for admission for stroke TIA work-up.  Risk Prescription drug management.   Patient presented to the ED for evaluation of stroke symptoms.  Patient had onset of symptoms at 9 AM with upper extremity weakness.  She later developed speech disturbance in the afternoon.  Patient presented to the ED outside of tPA window.  No findings to suggest  large vessel occlusion on exam.  Neuro hospitalist was consulted.  Recommendation is for admission to the hospital for further stroke work-up.        Final Clinical Impression(s) / ED Diagnoses Final diagnoses:  Cerebrovascular accident (CVA), unspecified mechanism [facility])    Rx / DC Orders ED Discharge Orders     None         Dorie Rank, MD 06/22/22 2229

## 2022-06-22 NOTE — H&P (Signed)
History and Physical    PLEASE NOTE THAT DRAGON DI[employer] SOFTWARE WAS USED IN THE CONSTRUCTION OF THIS NOTE.   Katrina Rios ZCH:[id number] DOB: 1936/01/21 DOA: 06/22/2022  PCP: Kaleen Mask, MD *** Patient coming from: home ***  I have personally briefly reviewed patient's old medical records in [facility] Link  Chief Complaint: ***  HPI: Katrina Rios is a 86 y.o. female with medical history significant for *** who is admitted to [facility] on 06/22/2022 with *** after presenting from home*** to [facility] ED complaining of ***.    ***    ***SOB: Denies any associated orthopnea, PND, or new onset peripheral edema. No recent chest pain, diaphoresis, palpitations, N/V, pre-syncope, or syncope. Not associated with any recent cough, wheezing, hemoptysis, new lower extremity erythema, or calf tenderness. Denies any recent trauma, travel, surgical procedures, or periods of prolonged diminished ambulatory status. No recent melena or hematochezia.   Denies any associated subjective fever, chills, rigors, or generalized myalgias. No recent headache, neck stiffness, rhinitis, rhinorrhea, sore throat, abdominal pain, diarrhea, or rash. No known recent COVID-19 exposures. Denies dysuria, gross hematuria, or change in urinary urgency/frequency.  ***   ***misc/infectious: Denies any subjective fever, chills, rigors, or generalized myalgias. Denies any recent headache, neck stiffness, rhinitis, rhinorrhea, sore throat, sob, wheezing, cough, nausea, vomiting, abdominal pain, diarrhea, or rash. No recent traveling or known COVID-19 exposures. Denies dysuria, gross hematuria, or change in urinary urgency/frequency.  Denies any recent chest pain, diaphoresis, or palpitations. ***    ED Course:  Vital signs in the ED were notable for the following: ***  Labs were notable for the following: ***  Imaging and additional notable ED work-up: ***  While in the ED, the following were  administered: ***  Subsequently, the patient was admitted  ***  ***red   Review of Systems: As per HPI otherwise 10 point review of systems negative.   Past Medical History:  Diagnosis Date   Closed fracture of right distal radius     Past Surgical History:  Procedure Laterality Date   ABDOMINAL HYSTERECTOMY     COLONOSCOPY     OPEN REDUCTION INTERNAL FIXATION (ORIF) DISTAL RADIAL FRACTURE Right 01/27/2021   Procedure: OPEN REDUCTION INTERNAL FIXATION (ORIF) DISTAL RADIAL FRACTURE;  Surgeon: Ernest Mallick, MD;  Location: [facility];  Service: Orthopedics;  Laterality: Right;   TONSILLECTOMY      Social History:  reports that she has never smoked. She has never used smokeless tobacco. She reports that she does not currently use alcohol. She reports that she does not use drugs.   Allergies  Allergen Reactions   Cephalosporins Swelling and Rash    History reviewed. No pertinent family history.  Family history reviewed and not pertinent ***   Prior to Admission medications   Medication Sig Start Date End Date Taking? Authorizing Provider  b complex vitamins capsule Take 1 capsule by mouth daily.   Yes [provider]  calcium carbonate (OS-CAL - DOSED IN MG OF ELEMENTAL CALCIUM) 1250 (500 Ca) MG tablet Take 1 tablet by mouth.   Yes [provider]  estradiol (ESTRACE) 0.5 MG tablet Take 0.5 mg by mouth daily. 06/08/22  Yes [provider]     Objective    Physical Exam: Vitals:   06/22/22 2000 06/22/22 2030 06/22/22 2100 06/22/22 2130  BP: (!) 180/80 (!) 179/80 (!) 175/75 (!) 190/76  Pulse: 64 64 64 67  Resp: 10 14 13  18  Temp:    97.8 F (36.6 C)  TempSrc:      SpO2: 100% 99% 98% 97%    General: appears to be stated age; alert, oriented Skin: warm, dry, no rash Head:  AT/[place] Mouth:  Oral mucosa membranes appear moist, normal dentition Neck: supple; trachea midline Heart:  RRR; did not appreciate any  M/R/G Lungs: CTAB, did not appreciate any wheezes, rales, or rhonchi Abdomen: + BS; soft, ND, NT Vascular: 2+ pedal pulses b/l; 2+ radial pulses b/l Extremities: no peripheral edema, no muscle wasting Neuro: strength and sensation intact in upper and lower extremities b/l    *** Neuro: 5/5 strength of the proximal and distal flexors and extensors of the upper and lower extremities bilaterally; sensation intact in upper and lower extremities b/l; cranial nerves II through XII grossly intact; no pronator drift; no evidence suggestive of slurred speech, dysarthria, or facial droop; Normal muscle tone. No tremors. *** Neuro: In the setting of the patient's current mental status and associated inability to follow instructions, unable to perform full neurologic exam at this time.  As such, assessment of strength, sensation, and cranial nerves is limited at this time. Patient noted to spontaneously move all 4 extremities. No tremors.  ***    Labs on Admission: I have personally reviewed following labs and imaging studies  CBC: Recent Labs  Lab 06/22/22 1752 06/22/22 2021  WBC  --  7.1  NEUTROABS  --  2.9  HGB 12.9 11.9*  HCT 38.0 37.8  MCV  --  98.7  PLT  --  242   Basic Metabolic Panel: Recent Labs  Lab 06/22/22 1752 06/22/22 2021  NA 137 137  K 4.8 4.8  CL 99 101  CO2  --  29  GLUCOSE 97 101*  BUN 24* 25*  CREATININE 1.20* 1.16*  CALCIUM  --  9.2   GFR: CrCl cannot be calculated (Unknown ideal weight.). Liver Function Tests: Recent Labs  Lab 06/22/22 2021  AST 23  ALT 14  ALKPHOS 62  BILITOT 0.5  PROT 7.4  ALBUMIN 4.0   No results for input(s): "LIPASE", "AMYLASE" in the last 168 hours. No results for input(s): "AMMONIA" in the last 168 hours. Coagulation Profile: Recent Labs  Lab 06/22/22 2021  INR 1.0   Cardiac Enzymes: No results for input(s): "CKTOTAL", "CKMB", "CKMBINDEX", "TROPONINI" in the last 168 hours. BNP (last 3 results) No results for  input(s): "PROBNP" in the last 8760 hours. HbA1C: No results for input(s): "HGBA1C" in the last 72 hours. CBG: Recent Labs  Lab 06/22/22 1727  GLUCAP 96   Lipid Profile: No results for input(s): "CHOL", "HDL", "LDLCALC", "TRIG", "CHOLHDL", "LDLDIRECT" in the last 72 hours. Thyroid Function Tests: No results for input(s): "TSH", "T4TOTAL", "FREET4", "T3FREE", "THYROIDAB" in the last 72 hours. Anemia Panel: No results for input(s): "VITAMINB12", "FOLATE", "FERRITIN", "TIBC", "IRON", "RETICCTPCT" in the last 72 hours. Urine analysis:    Component Value Date/Time   COLORURINE COLORLESS (A) 06/22/2022 2000   APPEARANCEUR CLEAR 06/22/2022 2000   LABSPEC 1.003 (L) 06/22/2022 2000   PHURINE 8.0 06/22/2022 2000   GLUCOSEU NEGATIVE 06/22/2022 2000   HGBUR NEGATIVE 06/22/2022 2000   BILIRUBINUR NEGATIVE 06/22/2022 2000   KETONESUR NEGATIVE 06/22/2022 2000   PROTEINUR NEGATIVE 06/22/2022 2000   NITRITE NEGATIVE 06/22/2022 2000   LEUKOCYTESUR NEGATIVE 06/22/2022 2000    Radiological Exams on Admission: CT HEAD WO CONTRAST  Result Date: 06/22/2022 CLINICAL DATA:  Slurred speech starting at 3:30 p.m., weakness in the left hand.  EXAM: CT HEAD WITHOUT CONTRAST TECHNIQUE: Contiguous axial images were obtained from the base of the skull through the vertex without intravenous contrast. RADIATION DOSE REDUCTION: This exam was performed according to the departmental dose-optimization program which includes automated exposure control, adjustment of the mA and/or kV according to patient size and/or use of iterative reconstruction technique. COMPARISON:  None Available. FINDINGS: Brain: The brainstem, cerebellum, cerebral peduncles, thalami, basal ganglia, basilar cisterns, and ventricular system appear within normal limits. Periventricular white matter and corona radiata hypodensities favor chronic ischemic microvascular white matter disease. No intracranial hemorrhage, mass lesion, or acute CVA. Vascular:  Unremarkable Skull: Unremarkable Sinuses/Orbits: Small mucous retention cyst in the left maxillary sinus. Other: No supplemental non-categorized findings. IMPRESSION: 1. No acute intracranial findings. 2. Periventricular white matter and corona radiata hypodensities favor chronic ischemic microvascular white matter disease. 3. Small mucous retention cyst in the left maxillary sinus. Electronically Signed   By: Gaylyn Rong M.D.   On: 06/22/2022 18:38     EKG: Independently reviewed, with result as described above. ***   Assessment/Plan    Principal Problem:   TIA (transient ischemic attack)  ***      ***          ***           ***          ***          ***          ***          ***          ***     ***  DVT prophylaxis: SCD's ***  Code Status: Full code*** Family Communication: none*** Disposition Plan: Per Rounding Team Consults called: none***;  Admission status: ***   PLEASE NOTE THAT DRAGON DICTATION SOFTWARE WAS USED IN THE CONSTRUCTION OF THIS NOTE.   Chaney Born Tashianna Broome DO [facility] From 7PM - 7AM   06/22/2022, 11:11 PM   ***

## 2022-06-22 NOTE — Consult Note (Signed)
[facility] TeleNeurology Consult Services  Stat Consult  Patient Name:   Katrina Rios, Katrina Rios Date of Birth:   [birth year] Identification Number:   MRN - [id number] Date of Service:   06/22/2022 21:25:04  Diagnosis:       R29.810 - Facial numbness/ Facial weakness  Impression Rt facial droop and b/l hand weakness. Etiology is suspicious for a stroke. HCT showed no acute pathology. Patient is not a candidate for IV thrombolytics, outside the window and no disabling symptoms. Low suspicion for LVO, not a candidate for MER. Recommend admission for stroke workup as below.   Recommendations: Our recommendations are outlined below.  Diagnostic Studies : MRI head without contrast Please order MRA head without contrast Please order MRA neck with contrast Please order  Laboratory Studies : Lipid panel Please order Hemoglobin A1c Please order  Antithrombotic Medication : Initiate dual antiplatelet therapy with Aspirin 81 mg daily and Clopidogrel 75 mg daily Please order Permissive hypertension, Antihypertensives with prn for first 24-48 hrs post stroke onset. If BP greater than 220/120 give Labetalol IV or Vasotec IV Please order Statins for LDL goal less than 70 Please order  Nursing Recommendations : IV Fluids, avoid dextrose containing fluids, Maintain euglycemiaNeuro checks q4 hrs x 24 hrs and Allix Blomquist per shiftContinue with Telemetry  Consultations : Recommend Speech therapy if failed dysphagia screenPhysical therapy/Occupational therapy  DVT Prophylaxis : Choice of Primary Team  Disposition : Neurology will follow  ----------------------------------------------------------------------------------------------------    Metrics: [facility] Notification Time: 06/22/2022 21:23:12 Stamp Time: 06/22/2022 21:25:04 Callback Response Time: 06/22/2022 21:27:08  Primary Provider Notified of Diagnostic Impression and Management Plan on: 06/22/2022  22:02:30   CT HEAD: Reviewed    ----------------------------------------------------------------------------------------------------  Chief Complaint: weakness  History of Present Illness: Patient is a 86 year old Female. 86 years old woman with PMHx of HLD presenting for evaluation of acute onset right facial droop and dropping objects from both hands. Last known well time is 9 AM today. Patient was accompanied by her husband at bedside. She stated that she had transient difficulty holding objects on her left hand.She also felt that had the same difficulty with her right hand lasting less than 5 minutes.(+) dysarthia. Denied history of stroke. Patient was on ASA 81 mg daily although discontinued a week ago due to bruising. Patient denies history of headaches, shortness of breath, chest pain, palpitations, or any focal neurological deficits.   Past Medical History:      Hyperlipidemia  Medications:  No Anticoagulant use  No Antiplatelet use Reviewed EMR for current medications  Allergies:  Reviewed  Social History: Smoking: No Alcohol Use: No Drug Use: No  Family History:  There Is Family History Of: non contributory There is no family history of premature cerebrovascular disease pertinent to this consultation  ROS : 14 Points Review of Systems was performed and was negative except mentioned in HPI.  Past Surgical History: There Is No Surgical History Contributory To Today's Visit   Examination: BP(190/76), Pulse(80), 1A: Level of Consciousness - Alert; keenly responsive + 0 1B: Ask Month and Age - Both Questions Right + 0 1C: Blink Eyes & Squeeze Hands - Performs Both Tasks + 0 2: Test Horizontal Extraocular Movements - Normal + 0 3: Test Visual Fields - No Visual Loss + 0 4: Test Facial Palsy (Use Grimace if Obtunded) - Partial paralysis (lower face) + 2 5A: Test Left Arm Motor Drift - No Drift for 10 Seconds + 0 5B: Test Right Arm Motor Drift - No Drift for  10 Seconds + 0 6A: Test Left Leg Motor Drift - No Drift for 5 Seconds + 0 6B: Test Right Leg Motor Drift - No Drift for 5 Seconds + 0 7: Test Limb Ataxia (FNF/Heel-Shin) - No Ataxia + 0 8: Test Sensation - Normal; No sensory loss + 0 9: Test Language/Aphasia - Normal; No aphasia + 0 10: Test Dysarthria - Normal + 0 11: Test Extinction/Inattention - No abnormality + 0  NIHSS Score: 2  Spoke with : Lynelle Doctor MD    Patient / Family was informed the Neurology Consult would occur via TeleHealth consult by way of interactive audio and video telecommunications and consented to receiving care in this manner.  Patient is being evaluated for possible acute neurologic impairment and high probability of imminent or life - threatening deterioration.I spent total of 35 minutes providing care to this patient, including time for face to face visit via telemedicine, review of medical records, imaging studies and discussion of findings with providers, the patient and / or family.   Dr Olga Millers Sophiamarie Nease   [facility] For Inpatient follow-up with [facility] physician please call [facility] [phone]. This is not an outpatient service. Post hospital discharge, please contact hospital directly.

## 2022-06-22 NOTE — ED Triage Notes (Signed)
Pt arrived via POV, c/o slurred speech since 1530 this afternoon. C/o wkn in left hand that started at around 9am.

## 2022-06-22 NOTE — ED Notes (Signed)
Patient transported to CT 

## 2022-06-23 ENCOUNTER — Observation Stay (HOSPITAL_COMMUNITY): Payer: Medicare Other

## 2022-06-23 ENCOUNTER — Inpatient Hospital Stay (HOSPITAL_COMMUNITY): Admit: 2022-06-23 | Payer: Medicare Other

## 2022-06-23 ENCOUNTER — Inpatient Hospital Stay (HOSPITAL_COMMUNITY): Payer: Medicare Other

## 2022-06-23 DIAGNOSIS — N179 Acute kidney failure, unspecified: Secondary | ICD-10-CM | POA: Diagnosis present

## 2022-06-23 DIAGNOSIS — R471 Dysarthria and anarthria: Secondary | ICD-10-CM | POA: Diagnosis present

## 2022-06-23 DIAGNOSIS — I6389 Other cerebral infarction: Secondary | ICD-10-CM

## 2022-06-23 DIAGNOSIS — Z881 Allergy status to other antibiotic agents status: Secondary | ICD-10-CM | POA: Diagnosis not present

## 2022-06-23 DIAGNOSIS — Z90710 Acquired absence of both cervix and uterus: Secondary | ICD-10-CM | POA: Diagnosis not present

## 2022-06-23 DIAGNOSIS — E860 Dehydration: Secondary | ICD-10-CM | POA: Diagnosis present

## 2022-06-23 DIAGNOSIS — Z79899 Other long term (current) drug therapy: Secondary | ICD-10-CM | POA: Diagnosis not present

## 2022-06-23 DIAGNOSIS — R29810 Facial weakness: Secondary | ICD-10-CM | POA: Diagnosis present

## 2022-06-23 DIAGNOSIS — N19 Unspecified kidney failure: Secondary | ICD-10-CM | POA: Diagnosis present

## 2022-06-23 DIAGNOSIS — I639 Cerebral infarction, unspecified: Secondary | ICD-10-CM | POA: Diagnosis not present

## 2022-06-23 DIAGNOSIS — D649 Anemia, unspecified: Secondary | ICD-10-CM | POA: Diagnosis present

## 2022-06-23 DIAGNOSIS — Z7982 Long term (current) use of aspirin: Secondary | ICD-10-CM | POA: Diagnosis not present

## 2022-06-23 DIAGNOSIS — R29898 Other symptoms and signs involving the musculoskeletal system: Secondary | ICD-10-CM | POA: Diagnosis present

## 2022-06-23 DIAGNOSIS — E785 Hyperlipidemia, unspecified: Secondary | ICD-10-CM | POA: Diagnosis present

## 2022-06-23 DIAGNOSIS — R279 Unspecified lack of coordination: Secondary | ICD-10-CM | POA: Diagnosis present

## 2022-06-23 DIAGNOSIS — R29702 NIHSS score 2: Secondary | ICD-10-CM | POA: Diagnosis present

## 2022-06-23 DIAGNOSIS — I63511 Cerebral infarction due to unspecified occlusion or stenosis of right middle cerebral artery: Secondary | ICD-10-CM | POA: Diagnosis present

## 2022-06-23 DIAGNOSIS — R030 Elevated blood-pressure reading, without diagnosis of hypertension: Secondary | ICD-10-CM | POA: Diagnosis present

## 2022-06-23 DIAGNOSIS — G459 Transient cerebral ischemic attack, unspecified: Secondary | ICD-10-CM | POA: Diagnosis present

## 2022-06-23 DIAGNOSIS — R7989 Other specified abnormal findings of blood chemistry: Secondary | ICD-10-CM | POA: Diagnosis present

## 2022-06-23 LAB — COMPREHENSIVE METABOLIC PANEL
ALT: 13 U/L (ref 0–44)
AST: 22 U/L (ref 15–41)
Albumin: 3.5 g/dL (ref 3.5–5.0)
Alkaline Phosphatase: 56 U/L (ref 38–126)
Anion gap: 7 (ref 5–15)
BUN: 19 mg/dL (ref 8–23)
CO2: 27 mmol/L (ref 22–32)
Calcium: 9 mg/dL (ref 8.9–10.3)
Chloride: 107 mmol/L (ref 98–111)
Creatinine, Ser: 0.99 mg/dL (ref 0.44–1.00)
GFR, Estimated: 56 mL/min — ABNORMAL LOW (ref >60)
Glucose, Bld: 81 mg/dL (ref 70–99)
Potassium: 4 mmol/L (ref 3.5–5.1)
Sodium: 141 mmol/L (ref 135–145)
Total Bilirubin: 0.7 mg/dL (ref 0.3–1.2)
Total Protein: 6.4 g/dL — ABNORMAL LOW (ref 6.5–8.1)

## 2022-06-23 LAB — CBC WITH DIFFERENTIAL/PLATELET
Abs Immature Granulocytes: 0.01 10*3/uL (ref 0.00–0.07)
Basophils Absolute: 0.1 10*3/uL (ref 0.0–0.1)
Basophils Relative: 1 %
Eosinophils Absolute: 0.1 10*3/uL (ref 0.0–0.5)
Eosinophils Relative: 2 %
HCT: 35.7 % — ABNORMAL LOW (ref 36.0–46.0)
Hemoglobin: 11.4 g/dL — ABNORMAL LOW (ref 12.0–15.0)
Immature Granulocytes: 0 %
Lymphocytes Relative: 53 %
Lymphs Abs: 3.4 10*3/uL (ref 0.7–4.0)
MCH: 31 pg (ref 26.0–34.0)
MCHC: 31.9 g/dL (ref 30.0–36.0)
MCV: 97 fL (ref 80.0–100.0)
Monocytes Absolute: 0.6 10*3/uL (ref 0.1–1.0)
Monocytes Relative: 9 %
Neutro Abs: 2.2 10*3/uL (ref 1.7–7.7)
Neutrophils Relative %: 35 %
Platelets: 210 10*3/uL (ref 150–400)
RBC: 3.68 MIL/uL — ABNORMAL LOW (ref 3.87–5.11)
RDW: 12.5 % (ref 11.5–15.5)
WBC: 6.3 10*3/uL (ref 4.0–10.5)
nRBC: 0 % (ref 0.0–0.2)

## 2022-06-23 LAB — LIPID PANEL
Cholesterol: 183 mg/dL (ref 0–200)
HDL: 64 mg/dL (ref >40)
LDL Cholesterol: 109 mg/dL — ABNORMAL HIGH (ref 0–99)
Total CHOL/HDL Ratio: 2.9 RATIO
Triglycerides: 49 mg/dL (ref <150)
VLDL: 10 mg/dL (ref 0–40)

## 2022-06-23 LAB — HEMOGLOBIN A1C
Hgb A1c MFr Bld: 5.5 % (ref 4.8–5.6)
Mean Plasma Glucose: 111.15 mg/dL

## 2022-06-23 LAB — ECHOCARDIOGRAM COMPLETE
AR max vel: 2.58 cm2
AV Peak grad: 2.9 mmHg
Ao pk vel: 0.85 m/s
Area-P 1/2: 2.87 cm2
S' Lateral: 2.4 cm

## 2022-06-23 LAB — IRON AND TIBC
Iron: 75 ug/dL (ref 28–170)
Saturation Ratios: 20 % (ref 10.4–31.8)
TIBC: 370 ug/dL (ref 250–450)
UIBC: 295 ug/dL

## 2022-06-23 LAB — MAGNESIUM: Magnesium: 2.2 mg/dL (ref 1.7–2.4)

## 2022-06-23 LAB — FERRITIN: Ferritin: 23 ng/mL (ref 11–307)

## 2022-06-23 MED ORDER — ROSUVASTATIN CALCIUM 20 MG PO TABS
20.0000 mg | ORAL_TABLET | Freq: Every day | ORAL | Status: DC
  Administered 2022-06-23 – 2022-06-24 (×2): 20 mg via ORAL
  Filled 2022-06-23 (×2): qty 1

## 2022-06-23 MED ORDER — ASPIRIN 81 MG PO CHEW
81.0000 mg | CHEWABLE_TABLET | Freq: Every day | ORAL | Status: DC
  Administered 2022-06-23 – 2022-06-24 (×2): 81 mg via ORAL
  Filled 2022-06-23 (×2): qty 1

## 2022-06-23 MED ORDER — LORAZEPAM 2 MG/ML IJ SOLN
0.5000 mg | Freq: Once | INTRAMUSCULAR | Status: AC
  Administered 2022-06-23: 0.5 mg via INTRAVENOUS
  Filled 2022-06-23: qty 1

## 2022-06-23 MED ORDER — ATORVASTATIN CALCIUM 40 MG PO TABS
80.0000 mg | ORAL_TABLET | Freq: Every day | ORAL | Status: DC
  Administered 2022-06-23: 80 mg via ORAL
  Filled 2022-06-23: qty 2

## 2022-06-23 MED ORDER — GADOPICLENOL 0.5 MMOL/ML IV SOLN
6.0000 mL | Freq: Once | INTRAVENOUS | Status: AC | PRN
  Administered 2022-06-23: 6 mL via INTRAVENOUS

## 2022-06-23 MED ORDER — ASPIRIN 300 MG RE SUPP
300.0000 mg | Freq: Every day | RECTAL | Status: DC
  Filled 2022-06-23 (×2): qty 1

## 2022-06-23 MED ORDER — CLOPIDOGREL BISULFATE 75 MG PO TABS
75.0000 mg | ORAL_TABLET | Freq: Every day | ORAL | Status: DC
  Administered 2022-06-23 – 2022-06-24 (×2): 75 mg via ORAL
  Filled 2022-06-23 (×2): qty 1

## 2022-06-23 NOTE — Consult Note (Signed)
Neurology Consultation    Reason for Consult:Stroke on MRI  CC: Right facial droop and bilateral hand weakness  HISTORY OF PRESENT ILLNESS   History is obtained from: HPI  Katrina Rios is a 86 y.o. female with a PMH of HLD presenting with bilateral upper extremity weakness and dysarthria.  She tells me that yesterday around 9 AM she was having difficulty holding a cup in her right hand and kept dropping it.  She also noted difficulty with her left hand.  Around 3 PM she started having slurred speech and her husband noticed a facial droop on the left.  They also tell me that a couple of weeks ago she felt like her left leg was weak and then resolved.  She denies any other episodes prior to the episode with her leg weakness.  She denies headache, dizziness, vision changes, problems swallowing, numbness, tingling in her extremities, abnormal movements, palpitations or cardiac issues.  She denies a history of stroke, migraine, atrial fibrillation, DVTs, or seizures.  Premorbid modified Rankin scale (mRS):  0-Completely asymptomatic and back to baseline post-stroke  ROS: Full ROS was performed and is negative except as noted in the HPI.   PAST MEDICAL HISTORY    Past Medical History:  Past Medical History:  Diagnosis Date   Closed fracture of right distal radius    HLD (hyperlipidemia)     No family history on file. History reviewed. No pertinent family history.  Allergies:  Allergies  Allergen Reactions   Cephalosporins Swelling and Rash    Social History:   reports that she has never smoked. She has never used smokeless tobacco. She reports that she does not currently use alcohol. She reports that she does not use drugs.    Medications (Not in a hospital admission)   EXAMINATION    Current vital signs:    06/23/2022    9:18 AM 06/23/2022    6:00 AM 06/23/2022    5:00 AM  Vitals with BMI  Systolic [id number]   Diastolic 78 81   Pulse 62 63 63    Examination:   GENERAL: Awake, alert in NAD HEENT: - Normocephalic and atraumatic, dry mm, no lymphadenopathy, no Thyromegally LUNGS - Clear to auscultation bilaterally CV - S1S2 RRR, equal pulses bilaterally. ABDOMEN - Soft, nontender, nondistended with normoactive BS Ext: warm, well perfused, intact peripheral pulses, no pedal edema  NEURO:  Mental Status: AA&Ox3  Language: speech is clear.  In tact naming, repetition, fluency, and comprehension. Cranial Nerves:  II: PERRL. Visual fields full to confrontation.  III, IV, VI: EOM in tact. Eyelids elevate symmetrically. Blinks to threat.  V: Sensation intact V1-3 symmetrically  VII: no facial asymmetry   VIII: hearing intact to voice IX, X: Palate elevates symmetrically. Phonation is normal.  [id number] shrug 5/5 and symmetrical  XII: tongue is midline without fasciculations. Motor:  RUE:  grip   5/5    biceps  5/5    triceps  5/5      LUE: grip  5/5    biceps   5/5    triceps  5/5 RLE: thigh  5/5    knee   5/5     plantar flexion   5/5     dorsiflexion   5/5        LLE: thigh   5/5    knee  5/5    plantar flexion    5/5     dorsiflexion    5/5 Tone: is normal and  bulk is normal DTRs: 2+ and symmetrical throughout   Sensation- Intact to light touch, pin prick, vibratory sensation, and temperature bilaterally Coordination: FTN intact bilaterally, no ataxia in BLE., no abnormal movements  Gait- deferred  NIHSS NIHSS components Score: Comment  1a Level of Conscious 0[x]  1[]  2[]  3[]         1b LOC Questions 0[x]  1[]  2[]           1c LOC Commands 0[x]  1[]  2[]           2 Best Gaze 0[x]  1[]  2[]           3 Visual 0[x]  1[]  2[]  3[]         4 Facial Palsy 0[x]  1[]  2[]  3[]         5a Motor Arm - left 0[x]  1[]  2[]  3[]  4[]  UN[]     5b Motor Arm - Right 0[x]  1[]  2[]  3[]  4[]  UN[]     6a Motor Leg - Left 0[x]  1[]  2[]  3[]  4[]  UN[]     6b Motor Leg - Right 0[x]  1[]  2[]  3[]  4[]  UN[]     7 Limb Ataxia 0[x]  1[]  2[]  3[]  UN[]       8 Sensory 0[x]  1[]  2[]  UN[]         9  Best Language 0[x]  1[]  2[]  3[]         10 Dysarthria 0[x]  1[]  2[]  UN[]         11 Extinct. and Inattention 0[x]  1[]  2[]           TOTAL: 0        LABS   I have reviewed labs in epic and the results pertinent to this consultation are:  Lab Results  Component Value Date   LDLCALC 109 (H) 06/23/2022   Lab Results  Component Value Date   ALT 13 06/23/2022   AST 22 06/23/2022   ALKPHOS 56 06/23/2022   BILITOT 0.7 06/23/2022   Lab Results  Component Value Date   HGBA1C 5.5 06/22/2022   Lab Results  Component Value Date   WBC 6.3 06/23/2022   HGB 11.4 (L) 06/23/2022   HCT 35.7 (L) 06/23/2022   MCV 97.0 06/23/2022   PLT 210 06/23/2022   No results found for: "VITAMINB12" No results found for: "FOLATE" Lab Results  Component Value Date   NA 141 06/23/2022   K 4.0 06/23/2022   CL 107 06/23/2022   CO2 27 06/23/2022     DIAGNOSTIC IMAGING/PROCEDURES   I have reviewed the images obtained:, as below    CT-head No acute intracranial findings. Periventricular white matter and corona radiata hypodensities favor chronic ischemic microvascular white matter disease.  MRI brain and MRI Head and Neck  1. Scattered small acute infarcts in the right MCA distribution without hemorrhage or mass effect. 2. Background chronic white matter microangiopathy and small remote lacunar infarct in the right cerebellar hemisphere. 3. Short-segment moderate stenosis with diminished flow related enhancement of a right M2 branch just after the bifurcation. The distal right MCA branches appear patent. 4. Short-segment high-grade stenosis/occlusion of a left M2 branch within the sylvian fissure and right A2 segment with distal reconstitution. 5. Moderate stenosis of the left A2 and bilateral P2 segments, and focal high-grade stenosis/occlusion of the right P2/P3 segment. 6. Patent vasculature of the neck with no hemodynamically significant stenosis or occlusion.   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  She does have small scattered infarcts in her right MCA distribution as well as a remote lacunar infarct in her right cerebellar hemisphere.  The acute infarcts appear to be embolic in pattern.  Her initial echocardiogram report states cannot rule out small PFO, so we will order a 2D echo with bubble study and a bilateral lower extremity venous duplex.  For now we will start Crestor 20 mg, aspirin 81 mg, and Plavix 75 mg.  Consider a loop recorder placement either inpatient or outpatient.   Recommendations: - HgbA1c, fasting lipid panel  Start atorvastatin 80mg  daily  - Recommend loop recorder placement - Frequent neuro checks - Echocardiogram with bubble study - Prophylactic therapy-Antiplatelet med: Aspirin 81mg  and plavix 75mg  x 3 months and then Aspirin 81mg  alone - Risk factor modification - Telemetry monitoring - PT consult, OT consult, Speech consult - Follow up with Dr. Leonie [facility] at [facility] in 8 weeks  -- Patient seen and examined by NP/APP with MD. MD to update note as needed.   Janine Ores, DNP, FNP-BC [facility] Pager: [phone]   **This documentation was dictated using Thrall and may contain inadvertent errors **  NEUROHOSPITALIST ADDENDUM Performed a face to face diagnostic evaluation.   I have reviewed the contents of history and physical exam as documented by PA/ARNP/Resident and agree with above documentation.  I have discussed and formulated the above plan as documented. Edits to the note have been made as needed.  Impression/Key exam findings/Plan: scattered R MCA distribution infarct on imaging. Appears to be embolic stroke of undetermined source. 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 Will also started Atorvastatin 80mg  daily. TTE with normal EF, asymmetric Left ventricular hypertrophy of the basal septum. Cannot exclude small PFO. Unclear from Echo report if they saw a ?PFO or if there is concern for a PFO. Will get bubble study and lower ext duplex. However, unlikely for a paradoxical emboli to present for the first time in 32s.  She will need loop recorder outpatient. Her husband is a retired Administrator, [employer] and I spoke with him in detail.  I also did a full exam which was notable for mild incoordination in LUE and slowed rapid alternating movement in LUE.   Donnetta Simpers, MD [facility] [phone]   If 7pm to 7am, please call on call as listed on AMION.

## 2022-06-23 NOTE — Evaluation (Signed)
Physical Therapy One Time Evaluation Patient Details Name: Katrina Rios MRN: [id number] DOB: [birth year] Today's Date: 06/23/2022  History of Present Illness  86 year old female with history of hyperlipidemia but not on any medication presented with complaint of right upper extremity weakness that started suddenly on the same day of admission, 06/22/22. On presentation, she was hypertensive.  CT head did not show any acute intracranial process.  Her right upper extremity weakness resolved.  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 All education has been completed and the patient has no further questions.  Pt seen in ED. Pt ambulated in hallway and used bathroom.  Pt denies any current weakness or balance difficulties as well as none observed.  Pt reports feeling a little dizzy when first out of bed prior to admission however denies syncopal episodes.  BP 170/81 mmHg upon entering room and 172/78 mmHg after mobilizing.  See below for any follow-up Physical Therapy or equipment needs. PT is signing off. Thank you for this referral.        Recommendations for follow up therapy are one component of a multi-disciplinary discharge planning process, led by the attending physician.  Recommendations may be updated based on patient status, additional functional criteria and insurance authorization.  Follow Up Recommendations No PT follow up      Assistance Recommended at Discharge    Patient can return home with the following       Equipment Recommendations None recommended by PT  Recommendations for Other Services       Functional Status Assessment Patient has not had a recent decline in their functional status     Precautions / Restrictions Precautions Precautions: None      Mobility  Bed Mobility Overal bed  mobility: Modified Independent                  Transfers Overall transfer level: Modified independent                      Ambulation/Gait Ambulation/Gait assistance: Supervision, Modified independent (Device/Increase time) Gait Distance (Feet): 400 Feet Assistive device: None Gait Pattern/deviations: WFL(Within Functional Limits)       General Gait Details: pt reports no issues with ambulation at this time  Stairs            Wheelchair Mobility    Modified Rankin (Stroke Patients Only)       Balance Overall balance assessment: No apparent balance deficits (not formally assessed)                                           Pertinent Vitals/Pain Pain Assessment Pain Assessment: No/denies pain    Home Living Family/patient expects to be discharged to:: Private residence Living Arrangements: Spouse/significant other Available Help at Discharge: Family Type of Home: House         Home Layout: Able to live on main level with bedroom/bathroom Home Equipment: None      Prior Function Prior Level of Function : Independent/Modified Independent                     Hand Dominance        Extremity/Trunk Assessment   Upper Extremity Assessment  Upper Extremity Assessment: Overall WFL for tasks assessed    Lower Extremity Assessment Lower Extremity Assessment: Overall WFL for tasks assessed    Cervical / Trunk Assessment Cervical / Trunk Assessment: Normal  Communication   Communication: No difficulties  Cognition Arousal/Alertness: Awake/alert Behavior During Therapy: WFL for tasks assessed/performed Overall Cognitive Status: Within Functional Limits for tasks assessed                                          General Comments      Exercises     Assessment/Plan    PT Assessment Patient does not need any further PT services  PT Problem List         PT Treatment Interventions       PT Goals (Current goals can be found in the Care Plan section)  Acute Rehab PT Goals PT Goal Formulation: All assessment and education complete, DC therapy    Frequency       Co-evaluation               AM-PAC PT "6 Clicks" Mobility  Outcome Measure Help needed turning from your back to your side while in a flat bed without using bedrails?: None Help needed moving from lying on your back to sitting on the side of a flat bed without using bedrails?: None Help needed moving to and from a bed to a chair (including a wheelchair)?: None Help needed standing up from a chair using your arms (e.g., wheelchair or bedside chair)?: None Help needed to walk in hospital room?: None Help needed climbing 3-5 steps with a railing? : None 6 Click Score: 24    End of Session   Activity Tolerance: Patient tolerated treatment well Patient left: in bed;with call bell/phone within reach;with family/visitor present   PT Visit Diagnosis: Difficulty in walking, not elsewhere classified (R26.2)    Time: 1248-1300 PT Time Calculation (min) (ACUTE ONLY): 12 min   Charges:   PT Evaluation $PT Eval Low Complexity: 1 Low        Kati PT, DPT Physical Therapist Acute Rehabilitation Services Preferred contact method: Secure Chat Weekend Pager Only: [phone] Office: [place] 06/23/2022, 2:05 PM

## 2022-06-23 NOTE — Progress Notes (Signed)
OT Cancellation Note  Patient Details Name: Katrina Rios MRN: [id number] DOB: 05-Oct-1935   Cancelled Treatment:    Reason Eval/Treat Not Completed: OT screened, no needs identified, will sign off Evaluating PT and patient endorse patient at back to baseline with no deficits. Patient reported she does not need OT at this time. OT singing off at this time. Thank you for the referral  Rennie Plowman, [facility] Office# [phone] 06/23/2022, 2:08 PM

## 2022-06-23 NOTE — Progress Notes (Signed)
Attempted lower extremity venous duplex x2; RN care, then with echo. Will attempt again 9/27 as schedule permits.  06/23/2022 4:28 PM Kelby Aline., MHA, RVT, RDCS, RDMS

## 2022-06-23 NOTE — Progress Notes (Addendum)
PROGRESS NOTE  Katrina Rios  [id number] DOB: 08-Oct-1935 DOA: 06/22/2022 PCP: Leonard Downing, MD   Brief Narrative: Patient is a 86 year old female with history of hyperlipidemia but not on any medication presented with complaint of right upper extremity weakness that started suddenly on the same day of admission.  There was also report that she developed some slurred speech briefly which resolved within 1 to 2 hours.  No problem with ambulation.  No history of previous stroke.  Husband is a retired Engineer, drilling.  On presentation, she was hypertensive.  CT head did not show any acute intracranial process.  Her right upper extremity weakness resolved.  MRI of the brain showed scattered small acute infarcts in the right MCA distribution without hemorrhage or mass effect.  Admitted for stroke work-up.  Neurology consulted.  Work-up pending.  Assessment & Plan:  Principal Problem:   TIA (transient ischemic attack) Active Problems:   Dysarthria   Weakness of right upper extremity   HLD (hyperlipidemia)   Dehydration   Acute prerenal azotemia   Normocytic anemia  Acute ischemic stroke: Presented with sudden onset of right upper extremity weakness which has resolved.  Currently alert oriented.  Currently no focal neurological deficits. MRI of the brain showed scattered small acute infarcts in the right MCA distribution without hemorrhage or mass effect.  MRA showed multifocal intracranial stenosis but no large vessel occlusion or any stenosis in the neck. Neurology consulted and following.  Currently on aspirin, Plavix. LDL of 109.  A1c 5.5. Started on crestor 20 mg daily.  PT/OT/speech therapy pending.  Echo pending.  Further work-up as per neurology  Hypertension: Blood pressure fluctuating, blood pressure more on the higher side.  Continue with permissive hypertension .May need antihypertensives on discharge  Hyperlipidemia: Currently not taking any medications.  LDL of 109.  Target  less than 70.  Continue crestor  AKI: Resolved with IV fluids           DVT prophylaxis:SCDs Start: 06/22/22 2304     Code Status: Full Code  Family Communication: Husband at bedside  Patient status:Inpatient  Patient is from :Home  Anticipated discharge [id number]  Estimated DC date:1-2 days   Consultants: None  Procedures:None  Antimicrobials:  Anti-infectives (From admission, onward)    None       Subjective: Patient seen and examined at bedside today.  Hemodynamically stable.  Husband at bedside.  She is completely alert and oriented, no focal neurological deficits.  Eager to go home   Objective: Vitals:   06/23/22 1130 06/23/22 1145 06/23/22 1200 06/23/22 1215  BP: (!) 157/74 (!) 169/82 (!) 155/76 (!) 164/80  Pulse: 70 65 61 60  Resp: 17 19 18 17   Temp:      TempSrc:      SpO2: 98% 100% 96% 100%   No intake or output data in the 24 hours ending 06/23/22 1320 There were no vitals filed for this visit.  Examination:  General exam: Overall comfortable, not in distress HEENT: PERRL Respiratory system:  no wheezes or crackles  Cardiovascular system: S1 & S2 heard, RRR.  Gastrointestinal system: Abdomen is nondistended, soft and nontender. Central nervous system: Alert and oriented Extremities: No edema, no clubbing ,no cyanosis Skin: No rashes, no ulcers,no icterus     Data Reviewed: I have personally reviewed following labs and imaging studies  CBC: Recent Labs  Lab 06/22/22 1752 06/22/22 2021 06/23/22 0457  WBC  --  7.1 6.3  NEUTROABS  --  2.9 2.2  HGB 12.9 11.9* 11.4*  HCT 38.0 37.8 35.7*  MCV  --  98.7 97.0  PLT  --  231 [id number]   Basic Metabolic Panel: Recent Labs  Lab 06/22/22 1752 06/22/22 2021 06/22/22 2327 06/23/22 0457  NA 137 137  --  141  K 4.8 4.8  --  4.0  CL 99 101  --  107  CO2  --  29  --  27  GLUCOSE 97 101*  --  81  BUN 24* 25*  --  19  CREATININE 1.20* 1.16*  --  0.99  CALCIUM  --  9.2  --  9.0  MG  --   --   2.3 2.2     No results found for this or any previous visit (from the past 240 hour(s)).   Radiology Studies: ECHOCARDIOGRAM COMPLETE  Result Date: 06/23/2022    ECHOCARDIOGRAM REPORT   Patient Name:   Katrina Rios Date of Exam: 06/23/2022 Medical Rec #:  [id number]      Height:       68.0 in Accession #:    [id number]     Weight:       131.0 lb Date of Birth:  08/23/1936      BSA:          1.707 m Patient Age:    53 years       BP:           171/81 mmHg Patient Gender: F              HR:           63 bpm. Exam Location:  Inpatient Procedure: 2D Echo, Cardiac Doppler and Color Doppler Indications:    TIA  History:        Patient has no prior history of Echocardiogram examinations.                 Risk Factors:Dyslipidemia.  Sonographer:    Jefferey Pica Referring Phys: [id number] [place]  1. Left ventricular ejection fraction, by estimation, is 60 to 65%. The left ventricle has normal function. The left ventricle has no regional wall motion abnormalities. There is moderate asymmetric left ventricular hypertrophy of the basal-septal segment. Left ventricular diastolic parameters are consistent with Grade I diastolic dysfunction (impaired relaxation).  2. Right ventricular systolic function is normal. The right ventricular size is normal. There is normal pulmonary artery systolic pressure.  3. The mitral valve is normal in structure. Trivial mitral valve regurgitation. No evidence of mitral stenosis.  4. The aortic valve is tricuspid. Aortic valve regurgitation is not visualized. No aortic stenosis is present.  5. Cannot exclude a small PFO (best seen in subcostal view). Comparison(s): No prior Echocardiogram. FINDINGS  Left Ventricle: Left ventricular ejection fraction, by estimation, is 60 to 65%. The left ventricle has normal function. The left ventricle has no regional wall motion abnormalities. The left ventricular internal cavity size was normal in size. There is  moderate  asymmetric left ventricular hypertrophy of the basal-septal segment. Left ventricular diastolic parameters are consistent with Grade I diastolic dysfunction (impaired relaxation). Right Ventricle: The right ventricular size is normal. No increase in right ventricular wall thickness. Right ventricular systolic function is normal. There is normal pulmonary artery systolic pressure. The tricuspid regurgitant velocity is 2.22 m/s, and  with an assumed right atrial pressure of 3 mmHg, the estimated right ventricular systolic pressure is [id number] mmHg. Left Atrium: Left atrial size was normal in size. Right  Atrium: Right atrial size was normal in size. Pericardium: There is no evidence of pericardial effusion. Mitral Valve: The mitral valve is normal in structure. Trivial mitral valve regurgitation. No evidence of mitral valve stenosis. Tricuspid Valve: The tricuspid valve is normal in structure. Tricuspid valve regurgitation is mild . No evidence of tricuspid stenosis. Aortic Valve: The aortic valve is tricuspid. Aortic valve regurgitation is not visualized. No aortic stenosis is present. Aortic valve peak gradient measures 2.9 mmHg. Pulmonic Valve: The pulmonic valve was normal in structure. Pulmonic valve regurgitation is trivial. No evidence of pulmonic stenosis. Aorta: The aortic root, ascending aorta and aortic arch are all structurally normal, with no evidence of dilitation or obstruction. IAS/Shunts: Cannot exclude a small PFO.  LEFT VENTRICLE PLAX 2D LVIDd:         4.50 cm   Diastology LVIDs:         2.40 cm   LV e' medial:    4.73 cm/s LV PW:         0.90 cm   LV E/e' medial:  8.9 LV IVS:        1.00 cm   LV e' lateral:   4.95 cm/s LVOT diam:     2.00 cm   LV E/e' lateral: 8.5 LV SV:         50 LV SV Index:   29 LVOT Area:     3.14 cm  RIGHT VENTRICLE             IVC RV Basal diam:  2.90 cm     IVC diam: 1.60 cm RV S prime:     10.60 cm/s TAPSE (M-mode): 2.2 cm LEFT ATRIUM             Index        RIGHT ATRIUM            Index LA diam:        3.10 cm 1.82 cm/m   RA Area:     11.00 cm LA Vol (A2C):   24.2 ml 14.18 ml/m  RA Volume:   23.50 ml  13.77 ml/m LA Vol (A4C):   29.1 ml 17.05 ml/m LA Biplane Vol: 27.4 ml 16.05 ml/m  AORTIC VALVE                 PULMONIC VALVE AV Area (Vmax): 2.58 cm     PV Vmax:       0.79 m/s AV Vmax:        84.95 cm/s   PV Peak grad:  2.5 mmHg AV Peak Grad:   2.9 mmHg LVOT Vmax:      69.80 cm/s LVOT Vmean:     45.700 cm/s LVOT VTI:       0.159 m  AORTA Ao Root diam: 3.70 cm Ao Asc diam:  3.00 cm MITRAL VALVE               TRICUSPID VALVE MV Area (PHT): 2.87 cm    TR Peak grad:   19.7 mmHg MV Decel Time: 264 msec    TR Vmax:        222.00 cm/s MV E velocity: 42.00 cm/s MV A velocity: 86.50 cm/s  SHUNTS MV E/A ratio:  0.49        Systemic VTI:  0.16 m                            Systemic Diam: 2.00 cm [employer]  MD Electronically signed by Rudean Haskell MD Signature Date/Time: 06/23/2022/11:32:31 AM    Final    MR BRAIN WO CONTRAST  Result Date: 06/23/2022 CLINICAL DATA:  Right facial droop and bilateral hand weakness. Evaluate for stroke. EXAM: MRI HEAD WITHOUT CONTRAST MRA HEAD WITHOUT CONTRAST MRA NECK WITHOUT AND WITH CONTRAST TECHNIQUE: Multiplanar, multi-echo pulse sequences of the brain and surrounding structures were acquired without intravenous contrast. Angiographic images of the Circle of Willis were acquired using MRA technique without intravenous contrast. Angiographic images of the neck were acquired using MRA technique without and with intravenous contrast. Carotid stenosis measurements (when applicable) are obtained utilizing NASCET criteria, using the distal internal carotid diameter as the denominator. CONTRAST:  6 cc Vueway COMPARISON:  Noncontrast CT head 1 day prior FINDINGS: MRI HEAD FINDINGS Brain: There are numerous scattered small foci of diffusion restriction with associated FLAIR signal abnormality in the right MCA distribution consistent with small  infarcts. There is no associated hemorrhage or mass effect. There is no acute infarct in the left cerebral hemisphere. There is no acute intracranial hemorrhage or extra-axial fluid collection. Background parenchymal volume is normal. The ventricles are normal in size. Gray-white differentiation is preserved. There are scattered foci of FLAIR signal abnormality throughout the remainder of the supratentorial white matter which are nonspecific but likely reflects sequela of underlying chronic small vessel ischemic change. There is a small remote lacunar infarct in the right cerebellar hemisphere. There is no mass lesion.  There is no mass effect or midline shift. Vascular: Normal flow voids. Skull and upper cervical spine: Normal marrow signal. Sinuses/Orbits: The paranasal sinuses are clear. The globes and orbits are unremarkable. Other: None. MRA HEAD FINDINGS Anterior circulation: The intracranial ICAs are patent. The right M1 segment is widely patent. There is moderate stenosis with diminished flow related enhancement in the proximal right M2 branch just after the bifurcation (1-115). The distal right MCA branches are patent. The left M1 segment is patent. There is short-segment high-grade stenosis/occlusion of a left M2 branch within the sylvian fissure with distal reconstitution (1-123). The other distal MCA branches appear patent The bilateral A1 segments are patent the anterior communicating artery is not definitely seen. There is short-segment high-grade stenosis or occlusion of the right A2 segment (1-166) with distal reconstitution. There is focal moderate stenosis of the left A2 segment (1-158). There is no aneurysm or AVM. Posterior circulation: The bilateral V4 segments are patent. The basilar artery is patent. The major cerebellar arteries appear patent. The bilateral PCAs are patent with multifocal irregularity and narrowing resulting in up to moderate stenosis of the bilateral P2 segments (1-90 1-93)  and high-grade stenosis/occlusion of the right P2/P3 segment (1-115). There is no aneurysm or AVM. Anatomic variants: None. MRA NECK FINDINGS Aortic arch: The imaged aortic arch is normal. The origins of the major branch vessels are patent. The subclavian arteries are patent to the level imaged. Right carotid system: The right common, internal, and external carotid arteries are patent, without hemodynamically significant stenosis or occlusion. There is no dissection or aneurysm. Left carotid system: The left common, internal, and external carotid arteries are patent, without hemodynamically significant stenosis or occlusion. There is no dissection or aneurysm. Vertebral arteries: The vertebral arteries are patent with antegrade flow. There is no hemodynamically significant stenosis or occlusion. There is no dissection or aneurysm. Other: None IMPRESSION: 1. Scattered small acute infarcts in the right MCA distribution without hemorrhage or mass effect. 2. Background chronic white matter microangiopathy and small remote lacunar  infarct in the right cerebellar hemisphere. 3. Short-segment moderate stenosis with diminished flow related enhancement of a right M2 branch just after the bifurcation. The distal right MCA branches appear patent. 4. Short-segment high-grade stenosis/occlusion of a left M2 branch within the sylvian fissure and right A2 segment with distal reconstitution. 5. Moderate stenosis of the left A2 and bilateral P2 segments, and focal high-grade stenosis/occlusion of the right P2/P3 segment. 6. Patent vasculature of the neck with no hemodynamically significant stenosis or occlusion. Electronically Signed   By: Valetta Mole M.D.   On: 06/23/2022 08:38   MR ANGIO HEAD WO CONTRAST  Result Date: 06/23/2022 CLINICAL DATA:  Right facial droop and bilateral hand weakness. Evaluate for stroke. EXAM: MRI HEAD WITHOUT CONTRAST MRA HEAD WITHOUT CONTRAST MRA NECK WITHOUT AND WITH CONTRAST TECHNIQUE:  Multiplanar, multi-echo pulse sequences of the brain and surrounding structures were acquired without intravenous contrast. Angiographic images of the Circle of Willis were acquired using MRA technique without intravenous contrast. Angiographic images of the neck were acquired using MRA technique without and with intravenous contrast. Carotid stenosis measurements (when applicable) are obtained utilizing NASCET criteria, using the distal internal carotid diameter as the denominator. CONTRAST:  6 cc Vueway COMPARISON:  Noncontrast CT head 1 day prior FINDINGS: MRI HEAD FINDINGS Brain: There are numerous scattered small foci of diffusion restriction with associated FLAIR signal abnormality in the right MCA distribution consistent with small infarcts. There is no associated hemorrhage or mass effect. There is no acute infarct in the left cerebral hemisphere. There is no acute intracranial hemorrhage or extra-axial fluid collection. Background parenchymal volume is normal. The ventricles are normal in size. Gray-white differentiation is preserved. There are scattered foci of FLAIR signal abnormality throughout the remainder of the supratentorial white matter which are nonspecific but likely reflects sequela of underlying chronic small vessel ischemic change. There is a small remote lacunar infarct in the right cerebellar hemisphere. There is no mass lesion.  There is no mass effect or midline shift. Vascular: Normal flow voids. Skull and upper cervical spine: Normal marrow signal. Sinuses/Orbits: The paranasal sinuses are clear. The globes and orbits are unremarkable. Other: None. MRA HEAD FINDINGS Anterior circulation: The intracranial ICAs are patent. The right M1 segment is widely patent. There is moderate stenosis with diminished flow related enhancement in the proximal right M2 branch just after the bifurcation (1-115). The distal right MCA branches are patent. The left M1 segment is patent. There is short-segment  high-grade stenosis/occlusion of a left M2 branch within the sylvian fissure with distal reconstitution (1-123). The other distal MCA branches appear patent The bilateral A1 segments are patent the anterior communicating artery is not definitely seen. There is short-segment high-grade stenosis or occlusion of the right A2 segment (1-166) with distal reconstitution. There is focal moderate stenosis of the left A2 segment (1-158). There is no aneurysm or AVM. Posterior circulation: The bilateral V4 segments are patent. The basilar artery is patent. The major cerebellar arteries appear patent. The bilateral PCAs are patent with multifocal irregularity and narrowing resulting in up to moderate stenosis of the bilateral P2 segments (1-90 1-93) and high-grade stenosis/occlusion of the right P2/P3 segment (1-115). There is no aneurysm or AVM. Anatomic variants: None. MRA NECK FINDINGS Aortic arch: The imaged aortic arch is normal. The origins of the major branch vessels are patent. The subclavian arteries are patent to the level imaged. Right carotid system: The right common, internal, and external carotid arteries are patent, without hemodynamically significant stenosis or occlusion.  There is no dissection or aneurysm. Left carotid system: The left common, internal, and external carotid arteries are patent, without hemodynamically significant stenosis or occlusion. There is no dissection or aneurysm. Vertebral arteries: The vertebral arteries are patent with antegrade flow. There is no hemodynamically significant stenosis or occlusion. There is no dissection or aneurysm. Other: None IMPRESSION: 1. Scattered small acute infarcts in the right MCA distribution without hemorrhage or mass effect. 2. Background chronic white matter microangiopathy and small remote lacunar infarct in the right cerebellar hemisphere. 3. Short-segment moderate stenosis with diminished flow related enhancement of a right M2 branch just after the  bifurcation. The distal right MCA branches appear patent. 4. Short-segment high-grade stenosis/occlusion of a left M2 branch within the sylvian fissure and right A2 segment with distal reconstitution. 5. Moderate stenosis of the left A2 and bilateral P2 segments, and focal high-grade stenosis/occlusion of the right P2/P3 segment. 6. Patent vasculature of the neck with no hemodynamically significant stenosis or occlusion. Electronically Signed   By: Valetta Mole M.D.   On: 06/23/2022 08:38   MR ANGIO NECK W WO CONTRAST  Result Date: 06/23/2022 CLINICAL DATA:  Right facial droop and bilateral hand weakness. Evaluate for stroke. EXAM: MRI HEAD WITHOUT CONTRAST MRA HEAD WITHOUT CONTRAST MRA NECK WITHOUT AND WITH CONTRAST TECHNIQUE: Multiplanar, multi-echo pulse sequences of the brain and surrounding structures were acquired without intravenous contrast. Angiographic images of the Circle of Willis were acquired using MRA technique without intravenous contrast. Angiographic images of the neck were acquired using MRA technique without and with intravenous contrast. Carotid stenosis measurements (when applicable) are obtained utilizing NASCET criteria, using the distal internal carotid diameter as the denominator. CONTRAST:  6 cc Vueway COMPARISON:  Noncontrast CT head 1 day prior FINDINGS: MRI HEAD FINDINGS Brain: There are numerous scattered small foci of diffusion restriction with associated FLAIR signal abnormality in the right MCA distribution consistent with small infarcts. There is no associated hemorrhage or mass effect. There is no acute infarct in the left cerebral hemisphere. There is no acute intracranial hemorrhage or extra-axial fluid collection. Background parenchymal volume is normal. The ventricles are normal in size. Gray-white differentiation is preserved. There are scattered foci of FLAIR signal abnormality throughout the remainder of the supratentorial white matter which are nonspecific but likely  reflects sequela of underlying chronic small vessel ischemic change. There is a small remote lacunar infarct in the right cerebellar hemisphere. There is no mass lesion.  There is no mass effect or midline shift. Vascular: Normal flow voids. Skull and upper cervical spine: Normal marrow signal. Sinuses/Orbits: The paranasal sinuses are clear. The globes and orbits are unremarkable. Other: None. MRA HEAD FINDINGS Anterior circulation: The intracranial ICAs are patent. The right M1 segment is widely patent. There is moderate stenosis with diminished flow related enhancement in the proximal right M2 branch just after the bifurcation (1-115). The distal right MCA branches are patent. The left M1 segment is patent. There is short-segment high-grade stenosis/occlusion of a left M2 branch within the sylvian fissure with distal reconstitution (1-123). The other distal MCA branches appear patent The bilateral A1 segments are patent the anterior communicating artery is not definitely seen. There is short-segment high-grade stenosis or occlusion of the right A2 segment (1-166) with distal reconstitution. There is focal moderate stenosis of the left A2 segment (1-158). There is no aneurysm or AVM. Posterior circulation: The bilateral V4 segments are patent. The basilar artery is patent. The major cerebellar arteries appear patent. The bilateral PCAs are patent  with multifocal irregularity and narrowing resulting in up to moderate stenosis of the bilateral P2 segments (1-90 1-93) and high-grade stenosis/occlusion of the right P2/P3 segment (1-115). There is no aneurysm or AVM. Anatomic variants: None. MRA NECK FINDINGS Aortic arch: The imaged aortic arch is normal. The origins of the major branch vessels are patent. The subclavian arteries are patent to the level imaged. Right carotid system: The right common, internal, and external carotid arteries are patent, without hemodynamically significant stenosis or occlusion. There is  no dissection or aneurysm. Left carotid system: The left common, internal, and external carotid arteries are patent, without hemodynamically significant stenosis or occlusion. There is no dissection or aneurysm. Vertebral arteries: The vertebral arteries are patent with antegrade flow. There is no hemodynamically significant stenosis or occlusion. There is no dissection or aneurysm. Other: None IMPRESSION: 1. Scattered small acute infarcts in the right MCA distribution without hemorrhage or mass effect. 2. Background chronic white matter microangiopathy and small remote lacunar infarct in the right cerebellar hemisphere. 3. Short-segment moderate stenosis with diminished flow related enhancement of a right M2 branch just after the bifurcation. The distal right MCA branches appear patent. 4. Short-segment high-grade stenosis/occlusion of a left M2 branch within the sylvian fissure and right A2 segment with distal reconstitution. 5. Moderate stenosis of the left A2 and bilateral P2 segments, and focal high-grade stenosis/occlusion of the right P2/P3 segment. 6. Patent vasculature of the neck with no hemodynamically significant stenosis or occlusion. Electronically Signed   By: Valetta Mole M.D.   On: 06/23/2022 08:38   CT HEAD WO CONTRAST  Result Date: 06/22/2022 CLINICAL DATA:  Slurred speech starting at 3:30 p.m., weakness in the left hand. EXAM: CT HEAD WITHOUT CONTRAST TECHNIQUE: Contiguous axial images were obtained from the base of the skull through the vertex without intravenous contrast. RADIATION DOSE REDUCTION: This exam was performed according to the departmental dose-optimization program which includes automated exposure control, adjustment of the mA and/or kV according to patient size and/or use of iterative reconstruction technique. COMPARISON:  None Available. FINDINGS: Brain: The brainstem, cerebellum, cerebral peduncles, thalami, basal ganglia, basilar cisterns, and ventricular system appear  within normal limits. Periventricular white matter and corona radiata hypodensities favor chronic ischemic microvascular white matter disease. No intracranial hemorrhage, mass lesion, or acute CVA. Vascular: Unremarkable Skull: Unremarkable Sinuses/Orbits: Small mucous retention cyst in the left maxillary sinus. Other: No supplemental non-categorized findings. IMPRESSION: 1. No acute intracranial findings. 2. Periventricular white matter and corona radiata hypodensities favor chronic ischemic microvascular white matter disease. 3. Small mucous retention cyst in the left maxillary sinus. Electronically Signed   By: Van Clines M.D.   On: 06/22/2022 18:38    Scheduled Meds:  aspirin  81 mg Oral Daily   Or   aspirin  300 mg Rectal Daily   clopidogrel  75 mg Oral Daily   rosuvastatin  20 mg Oral Daily   Continuous Infusions:  sodium chloride 100 mL/hr (06/22/22 1805)     LOS: 0 days   Shelly Coss, MD [facility] P9/26/2023, 1:20 PM

## 2022-06-23 NOTE — ED Notes (Signed)
Patient transported to MRI 

## 2022-06-23 NOTE — Progress Notes (Signed)
Went to do nursing admission history. Pt is currently asleep. Nurse states she has held in the ER for a long time and not to disturb patient at this time. Doroteo Bradford BSN, RN-BC Throughput Nurse 06/23/2022 8:46 PM

## 2022-06-24 ENCOUNTER — Inpatient Hospital Stay (HOSPITAL_COMMUNITY): Payer: Medicare Other

## 2022-06-24 DIAGNOSIS — I639 Cerebral infarction, unspecified: Secondary | ICD-10-CM

## 2022-06-24 MED ORDER — OXYCODONE HCL 5 MG PO TABS: 5.0000 mg | ORAL_TABLET | Freq: Once | ORAL | Status: DC

## 2022-06-24 MED ORDER — ASPIRIN 81 MG PO TBEC: 81.0000 mg | DELAYED_RELEASE_TABLET | Freq: Every day | ORAL | 0 refills | Status: DC

## 2022-06-24 MED ORDER — ROSUVASTATIN CALCIUM 20 MG PO TABS: 20.0000 mg | ORAL_TABLET | Freq: Every day | ORAL | 2 refills | Status: AC

## 2022-06-24 MED ORDER — CLOPIDOGREL BISULFATE 75 MG PO TABS: 75.0000 mg | ORAL_TABLET | Freq: Every day | ORAL | 3 refills | Status: DC

## 2022-06-24 NOTE — Progress Notes (Signed)
Bilateral lower extremity venous duplex has been completed. Preliminary results can be found in CV Proc through chart review.   06/24/22 10:07 AM Katrina Rios RVT

## 2022-06-24 NOTE — Discharge Summary (Signed)
[facility]  Physician Discharge Summary   Patient ID: Katrina Rios MRN: [id number] DOB/AGE: March 20, 1936 86 y.o.  Admit date: 06/22/2022 Discharge date: 06/24/2022    PCP: Leonard Downing, MD  DISCHARGE DIAGNOSES:   Acute ischemic stroke ([place])     HLD (hyperlipidemia)   Normocytic anemia    RECOMMENDATIONS FOR OUTPATIENT FOLLOW UP: Ambulatory referral sent to neurology Appointment has been made with cardiology for loop recorder    Home Health: None Equipment/Devices: None  CODE STATUS: Full code  DISCHARGE CONDITION: fair  Diet recommendation: Heart healthy  INITIAL HISTORY: Patient is a 86 year old female with history of hyperlipidemia but not on any medication presented with complaint of right upper extremity weakness that started suddenly on the same day of admission.  There was also report that she developed some slurred speech briefly which resolved within 1 to 2 hours.  No problem with ambulation.  No history of previous stroke.  Husband is a retired Engineer, drilling.  On presentation, she was hypertensive.  CT head did not show any acute intracranial process.  Her right upper extremity weakness resolved.  MRI of the brain showed scattered small acute infarcts in the right MCA distribution without hemorrhage or mass effect.  Admitted for stroke work-up.  Neurology consulted.  Consultations: Neurology  Procedures: Transthoracic echocardiogram Bubble study Lower extremity venous Dopplers   HOSPITAL COURSE:   Acute ischemic stroke: Presented with sudden onset of right upper extremity weakness which has resolved.  Currently alert oriented.  Currently no focal neurological deficits. MRI of the brain showed scattered small acute infarcts in the right MCA distribution without hemorrhage or mass effect.  MRA showed multifocal intracranial stenosis but no large vessel occlusion or any stenosis in the neck. Neurology consulted.  Recommend aspirin and Plavix for 3  months followed by Plavix alone.  Apparently she was taking aspirin previously so the recommendation has been changed from what is mentioned in the neurologist note. LDL of 109.  A1c 5.5. Started on crestor 20 mg daily.   Echocardiogram and bubble study did not show any shunts.   Seen by PT and OT and cleared.   Lower extremity Doppler study negative for DVT.   Ambulatory referral sent to neurology for outpatient follow-up.   Loop recorder was recommended by neurology.  A referral sent to cardiology.  She has an appointment next week.     Elevated blood pressure readings Does not have a known history of hypertension.  Permissive hypertension was allowed.  Blood pressure noted to be 136/76 on the day of discharge.  Will not be discharged on any blood pressure medications.  This will be deferred to outpatient providers.     Hyperlipidemia: Currently not taking any medications.  LDL of 109.  Target less than 70.  Continue crestor   AKI: Resolved with IV fluids   Patient is stable.  Discussed with patient and her husband who is a retired Administrator, [employer].  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  PERTINENT LABS:  The results of significant diagnostics from this hospitalization (including imaging, microbiology, ancillary and laboratory) are listed below for reference.    Labs:   Basic Metabolic Panel: Recent Labs  Lab 06/22/22 1752 06/22/22 2021 06/22/22 2327 06/23/22 0457  NA 137 137  --  141  K 4.8 4.8  --  4.0  CL 99 101  --  107  CO2  --  29  --  27  GLUCOSE 97 101*  --  81  BUN 24* 25*  --  19  CREATININE 1.20* 1.16*  --  0.99  CALCIUM  --  9.2  --  9.0  MG  --   --  2.3 2.2   Liver Function Tests: Recent Labs  Lab 06/22/22 2021 06/23/22 0457  AST 23 22  ALT 14 13  ALKPHOS 62 56  BILITOT 0.5 0.7  PROT 7.4 6.4*  ALBUMIN 4.0 3.5    CBC: Recent Labs  Lab 06/22/22 1752 06/22/22 2021 06/23/22 0457  WBC  --  7.1 6.3  NEUTROABS  --  2.9 2.2  HGB 12.9 11.9* 11.4*  HCT 38.0 37.8  35.7*  MCV  --  98.7 97.0  PLT  --  231 210     CBG: Recent Labs  Lab 06/22/22 1727  GLUCAP 96     IMAGING STUDIES VAS [place] LOWER EXTREMITY VENOUS (DVT)  Result Date: 06/24/2022  Lower Venous DVT Study Patient Name:  Katrina Rios  Date of Exam:   06/24/2022 Medical Rec #: [id number]       Accession #:    [id number] Date of Birth: May 02, 1936       Patient Gender: F Patient Age:   13 years Exam Location:  [facility] Procedure:      VAS [place] LOWER EXTREMITY VENOUS (DVT) Referring Phys: Janine Ores --------------------------------------------------------------------------------  Indications: Stroke.  Risk Factors: None identified. Comparison Study: No prior studies. Performing Technologist: Oliver Hum RVT  Examination Guidelines: A complete evaluation includes B-mode imaging, spectral Doppler, color Doppler, and power Doppler as needed of all accessible portions of each vessel. Bilateral testing is considered an integral part of a complete examination. Limited examinations for reoccurring indications may be performed as noted. The reflux portion of the exam is performed with the patient in reverse Trendelenburg.  +---------+---------------+---------+-----------+----------+--------------+ RIGHT    CompressibilityPhasicitySpontaneityPropertiesThrombus Aging +---------+---------------+---------+-----------+----------+--------------+ CFV      Full           Yes      Yes                                 +---------+---------------+---------+-----------+----------+--------------+ SFJ      Full                                                        +---------+---------------+---------+-----------+----------+--------------+ FV Prox  Full                                                        +---------+---------------+---------+-----------+----------+--------------+ FV Mid   Full                                                         +---------+---------------+---------+-----------+----------+--------------+ FV DistalFull                                                        +---------+---------------+---------+-----------+----------+--------------+  PFV      Full                                                        +---------+---------------+---------+-----------+----------+--------------+ POP      Full           Yes      Yes                                 +---------+---------------+---------+-----------+----------+--------------+ PTV      Full                                                        +---------+---------------+---------+-----------+----------+--------------+ PERO     Full                                                        +---------+---------------+---------+-----------+----------+--------------+   +---------+---------------+---------+-----------+----------+--------------+ LEFT     CompressibilityPhasicitySpontaneityPropertiesThrombus Aging +---------+---------------+---------+-----------+----------+--------------+ CFV      Full           Yes      Yes                                 +---------+---------------+---------+-----------+----------+--------------+ SFJ      Full                                                        +---------+---------------+---------+-----------+----------+--------------+ FV Prox  Full                                                        +---------+---------------+---------+-----------+----------+--------------+ FV Mid   Full                                                        +---------+---------------+---------+-----------+----------+--------------+ FV DistalFull                                                        +---------+---------------+---------+-----------+----------+--------------+ PFV      Full                                                         +---------+---------------+---------+-----------+----------+--------------+  POP      Full           Yes      Yes                                 +---------+---------------+---------+-----------+----------+--------------+ PTV      Full                                                        +---------+---------------+---------+-----------+----------+--------------+ PERO     Full                                                        +---------+---------------+---------+-----------+----------+--------------+     Summary: RIGHT: - There is no evidence of deep vein thrombosis in the lower extremity.  - No cystic structure found in the popliteal fossa.  LEFT: - There is no evidence of deep vein thrombosis in the lower extremity.  - No cystic structure found in the popliteal fossa.  *See table(s) above for measurements and observations. Electronically signed by Harold Barban MD on 06/24/2022 at 9:06:33 PM.    Final    ECHOCARDIOGRAM LIMITED BUBBLE STUDY  Result Date: 06/23/2022    ECHOCARDIOGRAM LIMITED REPORT   Patient Name:   Katrina Rios Date of Exam: 06/23/2022 Medical Rec #:  [id number]      Height:       68.0 in Accession #:    [id number]     Weight:       131.0 lb Date of Birth:  04-25-1936      BSA:          1.707 m Patient Age:    86 years       BP:           164/80 mmHg Patient Gender: F              HR:           63 bpm. Exam Location:  Inpatient Procedure: Saline Contrast Bubble Study and Limited Echo Indications:    Stroke  History:        Patient has prior history of Echocardiogram examinations, most                 recent 06/23/2022. Risk Factors:Dyslipidemia.  Sonographer:    Memory Argue Referring Phys: [id number] [place]  1. Limited study.  2. Agitated saline contrast bubble study was negative, with no evidence of any interatrial shunt.  3. Left ventricular ejection fraction, by estimation, is 60 to 65%. The left ventricle has normal function. FINDINGS  Left  Ventricle: Left ventricular ejection fraction, by estimation, is 60 to 65%. The left ventricle has normal function. IAS/Shunts: No atrial level shunt detected by color flow Doppler. Agitated saline contrast was given intravenously to evaluate for intracardiac shunting. Agitated saline contrast bubble study was negative, with no evidence of any interatrial shunt. Rudean Haskell MD Electronically signed by Rudean Haskell MD Signature Date/Time: 06/23/2022/4:53:21 PM    Final    ECHOCARDIOGRAM COMPLETE  Result Date: 06/23/2022    ECHOCARDIOGRAM REPORT   Patient Name:  Katrina Rios Date of Exam: 06/23/2022 Medical Rec #:  [id number]      Height:       68.0 in Accession #:    [id number]     Weight:       131.0 lb Date of Birth:  1936-09-24      BSA:          1.707 m Patient Age:    34 years       BP:           171/81 mmHg Patient Gender: F              HR:           63 bpm. Exam Location:  Inpatient Procedure: 2D Echo, Cardiac Doppler and Color Doppler Indications:    TIA  History:        Patient has no prior history of Echocardiogram examinations.                 Risk Factors:Dyslipidemia.  Sonographer:    Jefferey Pica Referring Phys: [id number] [place]  1. Left ventricular ejection fraction, by estimation, is 60 to 65%. The left ventricle has normal function. The left ventricle has no regional wall motion abnormalities. There is moderate asymmetric left ventricular hypertrophy of the basal-septal segment. Left ventricular diastolic parameters are consistent with Grade I diastolic dysfunction (impaired relaxation).  2. Right ventricular systolic function is normal. The right ventricular size is normal. There is normal pulmonary artery systolic pressure.  3. The mitral valve is normal in structure. Trivial mitral valve regurgitation. No evidence of mitral stenosis.  4. The aortic valve is tricuspid. Aortic valve regurgitation is not visualized. No aortic stenosis is present.  5.  Cannot exclude a small PFO (best seen in subcostal view). Comparison(s): No prior Echocardiogram. FINDINGS  Left Ventricle: Left ventricular ejection fraction, by estimation, is 60 to 65%. The left ventricle has normal function. The left ventricle has no regional wall motion abnormalities. The left ventricular internal cavity size was normal in size. There is  moderate asymmetric left ventricular hypertrophy of the basal-septal segment. Left ventricular diastolic parameters are consistent with Grade I diastolic dysfunction (impaired relaxation). Right Ventricle: The right ventricular size is normal. No increase in right ventricular wall thickness. Right ventricular systolic function is normal. There is normal pulmonary artery systolic pressure. The tricuspid regurgitant velocity is 2.22 m/s, and  with an assumed right atrial pressure of 3 mmHg, the estimated right ventricular systolic pressure is [id number] mmHg. Left Atrium: Left atrial size was normal in size. Right Atrium: Right atrial size was normal in size. Pericardium: There is no evidence of pericardial effusion. Mitral Valve: The mitral valve is normal in structure. Trivial mitral valve regurgitation. No evidence of mitral valve stenosis. Tricuspid Valve: The tricuspid valve is normal in structure. Tricuspid valve regurgitation is mild . No evidence of tricuspid stenosis. Aortic Valve: The aortic valve is tricuspid. Aortic valve regurgitation is not visualized. No aortic stenosis is present. Aortic valve peak gradient measures 2.9 mmHg. Pulmonic Valve: The pulmonic valve was normal in structure. Pulmonic valve regurgitation is trivial. No evidence of pulmonic stenosis. Aorta: The aortic root, ascending aorta and aortic arch are all structurally normal, with no evidence of dilitation or obstruction. IAS/Shunts: Cannot exclude a small PFO.  LEFT VENTRICLE PLAX 2D LVIDd:         4.50 cm   Diastology LVIDs:         2.40 cm   LV  e' medial:    4.73 cm/s LV PW:          0.90 cm   LV E/e' medial:  8.9 LV IVS:        1.00 cm   LV e' lateral:   4.95 cm/s LVOT diam:     2.00 cm   LV E/e' lateral: 8.5 LV SV:         50 LV SV Index:   29 LVOT Area:     3.14 cm  RIGHT VENTRICLE             IVC RV Basal diam:  2.90 cm     IVC diam: 1.60 cm RV S prime:     10.60 cm/s TAPSE (M-mode): 2.2 cm LEFT ATRIUM             Index        RIGHT ATRIUM           Index LA diam:        3.10 cm 1.82 cm/m   RA Area:     11.00 cm LA Vol (A2C):   24.2 ml 14.18 ml/m  RA Volume:   23.50 ml  13.77 ml/m LA Vol (A4C):   29.1 ml 17.05 ml/m LA Biplane Vol: 27.4 ml 16.05 ml/m  AORTIC VALVE                 PULMONIC VALVE AV Area (Vmax): 2.58 cm     PV Vmax:       0.79 m/s AV Vmax:        84.95 cm/s   PV Peak grad:  2.5 mmHg AV Peak Grad:   2.9 mmHg LVOT Vmax:      69.80 cm/s LVOT Vmean:     45.700 cm/s LVOT VTI:       0.159 m  AORTA Ao Root diam: 3.70 cm Ao Asc diam:  3.00 cm MITRAL VALVE               TRICUSPID VALVE MV Area (PHT): 2.87 cm    TR Peak grad:   19.7 mmHg MV Decel Time: 264 msec    TR Vmax:        222.00 cm/s MV E velocity: 42.00 cm/s MV A velocity: 86.50 cm/s  SHUNTS MV E/A ratio:  0.49        Systemic VTI:  0.16 m                            Systemic Diam: 2.00 cm Rudean Haskell MD Electronically signed by Rudean Haskell MD Signature Date/Time: 06/23/2022/11:32:31 AM    Final    MR BRAIN WO CONTRAST  Result Date: 06/23/2022 CLINICAL DATA:  Right facial droop and bilateral hand weakness. Evaluate for stroke. EXAM: MRI HEAD WITHOUT CONTRAST MRA HEAD WITHOUT CONTRAST MRA NECK WITHOUT AND WITH CONTRAST TECHNIQUE: Multiplanar, multi-echo pulse sequences of the brain and surrounding structures were acquired without intravenous contrast. Angiographic images of the Circle of Willis were acquired using MRA technique without intravenous contrast. Angiographic images of the neck were acquired using MRA technique without and with intravenous contrast. Carotid stenosis measurements (when  applicable) are obtained utilizing NASCET criteria, using the distal internal carotid diameter as the denominator. CONTRAST:  6 cc Vueway COMPARISON:  Noncontrast CT head 1 day prior FINDINGS: MRI HEAD FINDINGS Brain: There are numerous scattered small foci of diffusion restriction with associated FLAIR signal abnormality in the right MCA distribution consistent  with small infarcts. There is no associated hemorrhage or mass effect. There is no acute infarct in the left cerebral hemisphere. There is no acute intracranial hemorrhage or extra-axial fluid collection. Background parenchymal volume is normal. The ventricles are normal in size. Gray-white differentiation is preserved. There are scattered foci of FLAIR signal abnormality throughout the remainder of the supratentorial white matter which are nonspecific but likely reflects sequela of underlying chronic small vessel ischemic change. There is a small remote lacunar infarct in the right cerebellar hemisphere. There is no mass lesion.  There is no mass effect or midline shift. Vascular: Normal flow voids. Skull and upper cervical spine: Normal marrow signal. Sinuses/Orbits: The paranasal sinuses are clear. The globes and orbits are unremarkable. Other: None. MRA HEAD FINDINGS Anterior circulation: The intracranial ICAs are patent. The right M1 segment is widely patent. There is moderate stenosis with diminished flow related enhancement in the proximal right M2 branch just after the bifurcation (1-115). The distal right MCA branches are patent. The left M1 segment is patent. There is short-segment high-grade stenosis/occlusion of a left M2 branch within the sylvian fissure with distal reconstitution (1-123). The other distal MCA branches appear patent The bilateral A1 segments are patent the anterior communicating artery is not definitely seen. There is short-segment high-grade stenosis or occlusion of the right A2 segment (1-166) with distal reconstitution. There  is focal moderate stenosis of the left A2 segment (1-158). There is no aneurysm or AVM. Posterior circulation: The bilateral V4 segments are patent. The basilar artery is patent. The major cerebellar arteries appear patent. The bilateral PCAs are patent with multifocal irregularity and narrowing resulting in up to moderate stenosis of the bilateral P2 segments (1-90 1-93) and high-grade stenosis/occlusion of the right P2/P3 segment (1-115). There is no aneurysm or AVM. Anatomic variants: None. MRA NECK FINDINGS Aortic arch: The imaged aortic arch is normal. The origins of the major branch vessels are patent. The subclavian arteries are patent to the level imaged. Right carotid system: The right common, internal, and external carotid arteries are patent, without hemodynamically significant stenosis or occlusion. There is no dissection or aneurysm. Left carotid system: The left common, internal, and external carotid arteries are patent, without hemodynamically significant stenosis or occlusion. There is no dissection or aneurysm. Vertebral arteries: The vertebral arteries are patent with antegrade flow. There is no hemodynamically significant stenosis or occlusion. There is no dissection or aneurysm. Other: None IMPRESSION: 1. Scattered small acute infarcts in the right MCA distribution without hemorrhage or mass effect. 2. Background chronic white matter microangiopathy and small remote lacunar infarct in the right cerebellar hemisphere. 3. Short-segment moderate stenosis with diminished flow related enhancement of a right M2 branch just after the bifurcation. The distal right MCA branches appear patent. 4. Short-segment high-grade stenosis/occlusion of a left M2 branch within the sylvian fissure and right A2 segment with distal reconstitution. 5. Moderate stenosis of the left A2 and bilateral P2 segments, and focal high-grade stenosis/occlusion of the right P2/P3 segment. 6. Patent vasculature of the neck with no  hemodynamically significant stenosis or occlusion. Electronically Signed   By: Valetta Mole M.D.   On: 06/23/2022 08:38   MR ANGIO HEAD WO CONTRAST  Result Date: 06/23/2022 CLINICAL DATA:  Right facial droop and bilateral hand weakness. Evaluate for stroke. EXAM: MRI HEAD WITHOUT CONTRAST MRA HEAD WITHOUT CONTRAST MRA NECK WITHOUT AND WITH CONTRAST TECHNIQUE: Multiplanar, multi-echo pulse sequences of the brain and surrounding structures were acquired without intravenous contrast. Angiographic images of the  Circle of Willis were acquired using MRA technique without intravenous contrast. Angiographic images of the neck were acquired using MRA technique without and with intravenous contrast. Carotid stenosis measurements (when applicable) are obtained utilizing NASCET criteria, using the distal internal carotid diameter as the denominator. CONTRAST:  6 cc Vueway COMPARISON:  Noncontrast CT head 1 day prior FINDINGS: MRI HEAD FINDINGS Brain: There are numerous scattered small foci of diffusion restriction with associated FLAIR signal abnormality in the right MCA distribution consistent with small infarcts. There is no associated hemorrhage or mass effect. There is no acute infarct in the left cerebral hemisphere. There is no acute intracranial hemorrhage or extra-axial fluid collection. Background parenchymal volume is normal. The ventricles are normal in size. Gray-white differentiation is preserved. There are scattered foci of FLAIR signal abnormality throughout the remainder of the supratentorial white matter which are nonspecific but likely reflects sequela of underlying chronic small vessel ischemic change. There is a small remote lacunar infarct in the right cerebellar hemisphere. There is no mass lesion.  There is no mass effect or midline shift. Vascular: Normal flow voids. Skull and upper cervical spine: Normal marrow signal. Sinuses/Orbits: The paranasal sinuses are clear. The globes and orbits are  unremarkable. Other: None. MRA HEAD FINDINGS Anterior circulation: The intracranial ICAs are patent. The right M1 segment is widely patent. There is moderate stenosis with diminished flow related enhancement in the proximal right M2 branch just after the bifurcation (1-115). The distal right MCA branches are patent. The left M1 segment is patent. There is short-segment high-grade stenosis/occlusion of a left M2 branch within the sylvian fissure with distal reconstitution (1-123). The other distal MCA branches appear patent The bilateral A1 segments are patent the anterior communicating artery is not definitely seen. There is short-segment high-grade stenosis or occlusion of the right A2 segment (1-166) with distal reconstitution. There is focal moderate stenosis of the left A2 segment (1-158). There is no aneurysm or AVM. Posterior circulation: The bilateral V4 segments are patent. The basilar artery is patent. The major cerebellar arteries appear patent. The bilateral PCAs are patent with multifocal irregularity and narrowing resulting in up to moderate stenosis of the bilateral P2 segments (1-90 1-93) and high-grade stenosis/occlusion of the right P2/P3 segment (1-115). There is no aneurysm or AVM. Anatomic variants: None. MRA NECK FINDINGS Aortic arch: The imaged aortic arch is normal. The origins of the major branch vessels are patent. The subclavian arteries are patent to the level imaged. Right carotid system: The right common, internal, and external carotid arteries are patent, without hemodynamically significant stenosis or occlusion. There is no dissection or aneurysm. Left carotid system: The left common, internal, and external carotid arteries are patent, without hemodynamically significant stenosis or occlusion. There is no dissection or aneurysm. Vertebral arteries: The vertebral arteries are patent with antegrade flow. There is no hemodynamically significant stenosis or occlusion. There is no  dissection or aneurysm. Other: None IMPRESSION: 1. Scattered small acute infarcts in the right MCA distribution without hemorrhage or mass effect. 2. Background chronic white matter microangiopathy and small remote lacunar infarct in the right cerebellar hemisphere. 3. Short-segment moderate stenosis with diminished flow related enhancement of a right M2 branch just after the bifurcation. The distal right MCA branches appear patent. 4. Short-segment high-grade stenosis/occlusion of a left M2 branch within the sylvian fissure and right A2 segment with distal reconstitution. 5. Moderate stenosis of the left A2 and bilateral P2 segments, and focal high-grade stenosis/occlusion of the right P2/P3 segment. 6. Patent vasculature  of the neck with no hemodynamically significant stenosis or occlusion. Electronically Signed   By: Valetta Mole M.D.   On: 06/23/2022 08:38   MR ANGIO NECK W WO CONTRAST  Result Date: 06/23/2022 CLINICAL DATA:  Right facial droop and bilateral hand weakness. Evaluate for stroke. EXAM: MRI HEAD WITHOUT CONTRAST MRA HEAD WITHOUT CONTRAST MRA NECK WITHOUT AND WITH CONTRAST TECHNIQUE: Multiplanar, multi-echo pulse sequences of the brain and surrounding structures were acquired without intravenous contrast. Angiographic images of the Circle of Willis were acquired using MRA technique without intravenous contrast. Angiographic images of the neck were acquired using MRA technique without and with intravenous contrast. Carotid stenosis measurements (when applicable) are obtained utilizing NASCET criteria, using the distal internal carotid diameter as the denominator. CONTRAST:  6 cc [employer] COMPARISON:  Noncontrast CT head 1 day prior FINDINGS: MRI HEAD FINDINGS Brain: There are numerous scattered small foci of diffusion restriction with associated FLAIR signal abnormality in the right MCA distribution consistent with small infarcts. There is no associated hemorrhage or mass effect. There is no acute  infarct in the left cerebral hemisphere. There is no acute intracranial hemorrhage or extra-axial fluid collection. Background parenchymal volume is normal. The ventricles are normal in size. Gray-white differentiation is preserved. There are scattered foci of FLAIR signal abnormality throughout the remainder of the supratentorial white matter which are nonspecific but likely reflects sequela of underlying chronic small vessel ischemic change. There is a small remote lacunar infarct in the right cerebellar hemisphere. There is no mass lesion.  There is no mass effect or midline shift. Vascular: Normal flow voids. Skull and upper cervical spine: Normal marrow signal. Sinuses/Orbits: The paranasal sinuses are clear. The globes and orbits are unremarkable. Other: None. MRA HEAD FINDINGS Anterior circulation: The intracranial ICAs are patent. The right M1 segment is widely patent. There is moderate stenosis with diminished flow related enhancement in the proximal right M2 branch just after the bifurcation (1-115). The distal right MCA branches are patent. The left M1 segment is patent. There is short-segment high-grade stenosis/occlusion of a left M2 branch within the sylvian fissure with distal reconstitution (1-123). The other distal MCA branches appear patent The bilateral A1 segments are patent the anterior communicating artery is not definitely seen. There is short-segment high-grade stenosis or occlusion of the right A2 segment (1-166) with distal reconstitution. There is focal moderate stenosis of the left A2 segment (1-158). There is no aneurysm or AVM. Posterior circulation: The bilateral V4 segments are patent. The basilar artery is patent. The major cerebellar arteries appear patent. The bilateral PCAs are patent with multifocal irregularity and narrowing resulting in up to moderate stenosis of the bilateral P2 segments (1-90 1-93) and high-grade stenosis/occlusion of the right P2/P3 segment (1-115). There is  no aneurysm or AVM. Anatomic variants: None. MRA NECK FINDINGS Aortic arch: The imaged aortic arch is normal. The origins of the major branch vessels are patent. The subclavian arteries are patent to the level imaged. Right carotid system: The right common, internal, and external carotid arteries are patent, without hemodynamically significant stenosis or occlusion. There is no dissection or aneurysm. Left carotid system: The left common, internal, and external carotid arteries are patent, without hemodynamically significant stenosis or occlusion. There is no dissection or aneurysm. Vertebral arteries: The vertebral arteries are patent with antegrade flow. There is no hemodynamically significant stenosis or occlusion. There is no dissection or aneurysm. Other: None IMPRESSION: 1. Scattered small acute infarcts in the right MCA distribution without hemorrhage or mass effect.  2. Background chronic white matter microangiopathy and small remote lacunar infarct in the right cerebellar hemisphere. 3. Short-segment moderate stenosis with diminished flow related enhancement of a right M2 branch just after the bifurcation. The distal right MCA branches appear patent. 4. Short-segment high-grade stenosis/occlusion of a left M2 branch within the sylvian fissure and right A2 segment with distal reconstitution. 5. Moderate stenosis of the left A2 and bilateral P2 segments, and focal high-grade stenosis/occlusion of the right P2/P3 segment. 6. Patent vasculature of the neck with no hemodynamically significant stenosis or occlusion. Electronically Signed   By: Valetta Mole M.D.   On: 06/23/2022 08:38   CT HEAD WO CONTRAST  Result Date: 06/22/2022 CLINICAL DATA:  Slurred speech starting at 3:30 p.m., weakness in the left hand. EXAM: CT HEAD WITHOUT CONTRAST TECHNIQUE: Contiguous axial images were obtained from the base of the skull through the vertex without intravenous contrast. RADIATION DOSE REDUCTION: This exam was  performed according to the departmental dose-optimization program which includes automated exposure control, adjustment of the mA and/or kV according to patient size and/or use of iterative reconstruction technique. COMPARISON:  None Available. FINDINGS: Brain: The brainstem, cerebellum, cerebral peduncles, thalami, basal ganglia, basilar cisterns, and ventricular system appear within normal limits. Periventricular white matter and corona radiata hypodensities favor chronic ischemic microvascular white matter disease. No intracranial hemorrhage, mass lesion, or acute CVA. Vascular: Unremarkable Skull: Unremarkable Sinuses/Orbits: Small mucous retention cyst in the left maxillary sinus. Other: No supplemental non-categorized findings. IMPRESSION: 1. No acute intracranial findings. 2. Periventricular white matter and corona radiata hypodensities favor chronic ischemic microvascular white matter disease. 3. Small mucous retention cyst in the left maxillary sinus. Electronically Signed   By: Van Clines M.D.   On: 06/22/2022 18:38    DISCHARGE EXAMINATION: Vitals:   06/23/22 2152 06/24/22 0103 06/24/22 0601 06/24/22 0718  BP: (!) 166/88 (!) 144/84 (!) 153/86 136/76  Pulse: 60 70 65 65  Resp: 16 17 18 14   Temp: 97.7 F (36.5 C) 98.4 F (36.9 C) 97.8 F (36.6 C) 97.7 F (36.5 C)  TempSrc: Oral Oral Oral Oral  SpO2: 100% 96% 97% 98%  Weight:   59.5 kg    General appearance: Awake alert.  In no distress Resp: Clear to auscultation bilaterally.  Normal effort Cardio: S1-S2 is normal regular.  No S3-S4.  No rubs murmurs or bruit GI: Abdomen is soft.  Nontender nondistended.  Bowel sounds are present normal.  No masses organomegaly    DISPOSITION: Home with husband  Discharge Instructions     Ambulatory referral to Neurology   Complete by: As directed    An appointment is requested in approximately: 8 weeks for acute CVA   Call MD for:  difficulty breathing, headache or visual disturbances    Complete by: As directed    Call MD for:  extreme fatigue   Complete by: As directed    Call MD for:  persistant dizziness or light-headedness   Complete by: As directed    Call MD for:  persistant nausea and vomiting   Complete by: As directed    Call MD for:  severe uncontrolled pain   Complete by: As directed    Call MD for:  temperature >100.4   Complete by: As directed    Diet - low sodium heart healthy   Complete by: As directed    Discharge instructions   Complete by: As directed    Please take your medications as prescribed.  Follow-up with your primary care  provider in 1 week.  Referral has been sent to neurology and they will call you with an appointment.  Referral has also been sent to cardiology.  They will reach out to you regarding loop recorder placement.  Estradiol can increase the risk of stroke so this medication has been discontinued.  We can discuss this further with your PCP.  You were cared for by a hospitalist during your hospital stay. If you have any questions about your discharge medications or the care you received while you were in the hospital after you are discharged, you can call the unit and asked to speak with the hospitalist on call if the hospitalist that took care of you is not available. Once you are discharged, your primary care physician will handle any further medical issues. Please note that NO REFILLS for any discharge medications will be authorized once you are discharged, as it is imperative that you return to your primary care physician (or establish a relationship with a primary care physician if you do not have one) for your aftercare needs so that they can reassess your need for medications and monitor your lab values. If you do not have a primary care physician, you can call [phone] for a physician referral.   Increase activity slowly   Complete by: As directed           Allergies as of 06/24/2022       Reactions   Cephalosporins  Swelling, Rash        Medication List     STOP taking these medications    estradiol 0.5 MG tablet Commonly known as: ESTRACE       TAKE these medications    aspirin EC 81 MG tablet Take 1 tablet (81 mg total) by mouth daily. Swallow whole. For 3 months   b complex vitamins capsule Take 1 capsule by mouth daily.   calcium carbonate 1250 (500 Ca) MG tablet Commonly known as: OS-CAL - dosed in mg of elemental calcium Take 1 tablet by mouth.   clopidogrel 75 MG tablet Commonly known as: PLAVIX Take 1 tablet (75 mg total) by mouth daily.   rosuvastatin 20 MG tablet Commonly known as: CRESTOR Take 1 tablet (20 mg total) by mouth daily.          Follow-up Information     Leonard Downing, MD. Schedule an appointment as soon as possible for a visit in 1 week(s).   Specialty: Family Medicine Why: post hospitalization follow up Contact information: [place] [address] [phone]         Garvin Fila, MD Follow up.   Specialties: Neurology, Radiology Why: referral has been sent. office should call with appointment Contact information: [address] [place] [address] [phone]         Rex Kras, DO Follow up on 07/03/2022.   Specialties: Cardiology, Vascular Surgery Why: Please arrive at 12:30PM to discuss loop recorder. Contact information: [place] [address] [phone]                 TOTAL DISCHARGE TIME: 73 minutes  [facility] Pager on [email or web address]  06/25/2022, 12:21 PM

## 2022-06-24 NOTE — Progress Notes (Signed)
  Transition of Care [facility]) Screening Note   Patient Details  Name: Katrina Rios Date of Birth: October 12, 1935   Transition of Care [facility]) CM/SW Contact:    Roseanne Kaufman, RN Phone Number: 06/24/2022, 11:10 AM    Transition of Care Department [facility]) has reviewed patient and no TOC needs have been identified at this time. We will continue to monitor patient advancement through interdisciplinary progression rounds. If new patient transition needs arise, please place a TOC consult.

## 2022-06-24 NOTE — Progress Notes (Addendum)
Brief Neuro Update:  Bubble study with no PFO, BL lower ext dopplers are negative for DVT.  Recommend outpatient loop recorder. No further inpatient neurological workup needed at this time.  Was also notified that she was on aspirin previously. Would recommend 3 months of DAPT with Asa and Plavix, followed by Plavix monotherapy.  We will signoff. Please feel free to contact us with any questions or concerns.  [place] Pager Number [phone]

## 2022-06-24 NOTE — Plan of Care (Signed)
  Problem: Education: Goal: Knowledge of General Education information will improve Description Including pain rating scale, medication(s)/side effects and non-pharmacologic comfort measures Outcome: Progressing   Problem: Health Behavior/Discharge Planning: Goal: Ability to manage health-related needs will improve Outcome: Progressing   

## 2022-07-03 ENCOUNTER — Encounter: Payer: Self-pay | Admitting: Cardiology

## 2022-07-03 ENCOUNTER — Ambulatory Visit: Payer: Medicare Other | Admitting: Cardiology

## 2022-07-03 VITALS — BP 143/82 | HR 72 | Temp 97.7°F | Resp 16 | Ht 68.0 in | Wt 125.0 lb

## 2022-07-03 DIAGNOSIS — I639 Cerebral infarction, unspecified: Secondary | ICD-10-CM

## 2022-07-03 DIAGNOSIS — E782 Mixed hyperlipidemia: Secondary | ICD-10-CM

## 2022-07-03 NOTE — Progress Notes (Signed)
ID:  Katrina Rios, DOB 02-Jun-1936, MRN [id number]  PCP:  Leonard Downing, MD  Cardiologist:  Rex Kras, DO, [facility] (established care 07/03/2022)  REASON FOR CONSULT: Status post stroke  and discuss loop recorder implant  REQUESTING PHYSICIAN:  Leonard Downing, MD [address] [place],  [place] [address]  Chief Complaint  Patient presents with   Establish Care    Status post hospitalization for stroke.  Discussed loop recorder implantation    HPI  Katrina Rios is a 86 y.o. Caucasian female who presents to the clinic for evaluation of status post stroke and discussed loop recorder implant at the request of Leonard Downing, *. Her past medical history and cardiovascular risk factors include: Hyperlipidemia, status post CVA.  Was hospitalized at [facility] in September 2023 she presented to the ED with right upper extremity weakness, slurred speech.  The symptoms resolved within 1-2 hours.  She was hypertensive on arrival.  CT did not show any acute intracranial findings.  MRI of the brain noted scattered small acute infarcts in the right MCA distribution without hemorrhage or mass effect.  During hospitalization she was evaluated by neurology and also underwent transthoracic echo with bubble study.  LVEF noted to be preserved, grade 1 diastolic impairment, and agitated sonicated study negative for PFO.  Neurology recommended loop implantation the patient was referred to cardiology for the same.  Patient is accompanied by her husband at today's office visit, an internist by profession.  She denies anginal discomfort or heart failure symptoms.  She has residual left upper extremity weakness and has difficulty controlling her left hand.  Speech is slowly improving but not at baseline.  Home blood pressures are well controlled according to the husband and they are usually 130/70.  ALLERGIES: Allergies  Allergen Reactions   Cephalosporins Swelling and Rash    MEDICATION  LIST PRIOR TO VISIT: Current Meds  Medication Sig   aspirin EC 81 MG tablet Take 1 tablet (81 mg total) by mouth daily. Swallow whole. For 3 months   b complex vitamins capsule Take 1 capsule by mouth daily.   calcium carbonate (OS-CAL - DOSED IN MG OF ELEMENTAL CALCIUM) 1250 (500 Ca) MG tablet Take 1 tablet by mouth.   clopidogrel (PLAVIX) 75 MG tablet Take 1 tablet (75 mg total) by mouth daily.   rosuvastatin (CRESTOR) 20 MG tablet Take 1 tablet (20 mg total) by mouth daily.     PAST MEDICAL HISTORY: Past Medical History:  Diagnosis Date   Closed fracture of right distal radius    HLD (hyperlipidemia)    Stroke ([place])     PAST SURGICAL HISTORY: Past Surgical History:  Procedure Laterality Date   ABDOMINAL HYSTERECTOMY     COLONOSCOPY     OPEN REDUCTION INTERNAL FIXATION (ORIF) DISTAL RADIAL FRACTURE Right 01/27/2021   Procedure: OPEN REDUCTION INTERNAL FIXATION (ORIF) DISTAL RADIAL FRACTURE;  Surgeon: Verner Mould, MD;  Location: [facility];  Service: Orthopedics;  Laterality: Right;   TONSILLECTOMY      FAMILY HISTORY: The patient family history includes Alzheimer's disease in her sister; Stroke in her father.  SOCIAL HISTORY:  The patient  reports that she has never smoked. She has never used smokeless tobacco. She reports that she does not currently use alcohol. She reports that she does not use drugs.  REVIEW OF SYSTEMS: Review of Systems  Cardiovascular:  Negative for chest pain, claudication, dyspnea on exertion, irregular heartbeat, leg swelling, near-syncope, orthopnea, palpitations,  paroxysmal nocturnal dyspnea and syncope.  Respiratory:  Negative for shortness of breath.   Hematologic/Lymphatic: Negative for bleeding problem.  Musculoskeletal:  Negative for muscle cramps and myalgias.  Neurological:  Positive for dizziness (chronic) and focal weakness (LUE). Negative for light-headedness.   PHYSICAL EXAM:    07/03/2022   12:46 PM 06/24/2022     7:18 AM 06/24/2022    6:01 AM  Vitals with BMI  Height 5\' 8"     Weight 125 lbs  131 lbs 3 oz  BMI 79.02    Systolic 409 735 329  Diastolic 82 76 86  Pulse 72 65 65    Physical Exam  Constitutional: No distress.  Age appropriate, hemodynamically stable.   Neck: No JVD present.  Cardiovascular: Normal rate, regular rhythm, S1 normal, S2 normal, intact distal pulses and normal pulses. Exam reveals no gallop, no S3 and no S4.  No murmur heard. Pulses:      Dorsalis pedis pulses are 2+ on the right side and 2+ on the left side.       Posterior tibial pulses are 2+ on the right side and 2+ on the left side.  Pulmonary/Chest: Effort normal and breath sounds normal. No stridor. She has no wheezes. She has no rales.  Abdominal: Soft. Bowel sounds are normal. She exhibits no distension. There is no abdominal tenderness.  Musculoskeletal:        General: No edema.     Cervical back: Neck supple.  Neurological: She is alert and oriented to person, place, and time. She has intact cranial nerves (2-12).  Strength 5 out of 5 lower extremities bilaterally and right upper extremity.  Strength 4 out of 5 in the left upper extremity.  Mild facial asymmetry.  Skin: Skin is warm and moist.     RADIOLOGY:  MRA neck w/ and w/o contrast:  06/23/2022: 1. Scattered small acute infarcts in the right MCA distribution without hemorrhage or mass effect. 2. Background chronic white matter microangiopathy and small remote lacunar infarct in the right cerebellar hemisphere. 3. Short-segment moderate stenosis with diminished flow related enhancement of a right M2 branch just after the bifurcation. The distal right MCA branches appear patent. 4. Short-segment high-grade stenosis/occlusion of a left M2 branch within the sylvian fissure and right A2 segment with distal reconstitution. 5. Moderate stenosis of the left A2 and bilateral P2 segments, and focal high-grade stenosis/occlusion of the right P2/P3  segment. 6. Patent vasculature of the neck with no hemodynamically significant stenosis or occlusion.  CARDIAC DATABASE: EKG: 07/03/2022: Normal sinus rhythm, 66 bpm, without underlying ischemia or injury pattern.  Echocardiogram: 06/23/2022: LVEF 60-65%, moderate asymmetric left ventricular basal septal hypertrophy, grade 1 diastolic impairment, trivial MR, agitated sonicated study negative for PFO (on a limited 2D echo).  Stress Testing: No results found for this or any previous visit from the past 1095 days.  Heart Catheterization: None  Lower extremity venous duplex: 06/24/2022: Negative for DVT bilaterally.  LABORATORY DATA:    Latest Ref Rng & Units 06/23/2022    4:57 AM 06/22/2022    8:21 PM 06/22/2022    5:52 PM  CBC  WBC 4.0 - 10.5 K/uL 6.3  7.1    Hemoglobin 12.0 - 15.0 g/dL 11.4  11.9  12.9   Hematocrit 36.0 - 46.0 % 35.7  37.8  38.0   Platelets 150 - 400 K/uL 210  231         Latest Ref Rng & Units 06/23/2022    4:57 AM 06/22/2022  8:21 PM 06/22/2022    5:52 PM  CMP  Glucose 70 - 99 mg/dL 81  101  97   BUN 8 - 23 mg/dL 19  25  24    Creatinine 0.44 - 1.00 mg/dL 0.99  1.16  1.20   Sodium 135 - 145 mmol/L 141  137  137   Potassium 3.5 - 5.1 mmol/L 4.0  4.8  4.8   Chloride 98 - 111 mmol/L 107  101  99   CO2 22 - 32 mmol/L 27  29    Calcium 8.9 - 10.3 mg/dL 9.0  9.2    Total Protein 6.5 - 8.1 g/dL 6.4  7.4    Total Bilirubin 0.3 - 1.2 mg/dL 0.7  0.5    Alkaline Phos 38 - 126 U/L 56  62    AST 15 - 41 U/L 22  23    ALT 0 - 44 U/L 13  14      Lipid Panel     Component Value Date/Time   CHOL 183 06/23/2022 0457   TRIG 49 06/23/2022 0457   HDL 64 06/23/2022 0457   CHOLHDL 2.9 06/23/2022 0457   VLDL 10 06/23/2022 0457   LDLCALC 109 (H) 06/23/2022 0457    No components found for: "NTPROBNP" No results for input(s): "PROBNP" in the last 8760 hours. No results for input(s): "TSH" in the last 8760 hours.  BMP Recent Labs    06/22/22 1752 06/22/22 2021  06/23/22 0457  NA 137 137 141  K 4.8 4.8 4.0  CL 99 101 107  CO2  --  29 27  GLUCOSE 97 101* 81  BUN 24* 25* 19  CREATININE 1.20* 1.16* 0.99  CALCIUM  --  9.2 9.0  GFRNONAA  --  46* 56*    HEMOGLOBIN A1C Lab Results  Component Value Date   HGBA1C 5.5 06/22/2022   MPG 111.15 06/22/2022    IMPRESSION:    ICD-10-CM   1. Cerebrovascular accident (CVA), unspecified mechanism ([place])  I63.9 EKG 12-Lead    2. Mixed hyperlipidemia  E78.2        RECOMMENDATIONS: JAEDEN HOWSER is a 86 y.o. Caucasian female whose past medical history and cardiac risk factors include: Hyperlipidemia, status post CVA.  Patient was hospitalized at [facility] in September 2023 for acute ischemic stroke and was evaluated by medicine and neurology.  She underwent echocardiogram, bubble study, CT/MRI of the brain and neck, and laboratory testing.  These were reviewed as part of today's consultation.  From a cardiovascular standpoint discussed the role of transesophageal echocardiogram to evaluate for cardioembolic source.  After discussing the risks, benefits/complications patient and husband would like to hold off on TEE at this time as she did undergo echo with bubble study at the hospital.  From a arrhythmia standpoint we discussed the possibility of subclinical atrial fibrillation and/or dysrhythmias.  We discussed both modalities of cardiac monitoring for 2 weeks or loop recorder implantation.  After discussing the risks, benefits and limitations of both modality patient and husband would like to proceed with loop recorder implantation.  I have advised him to keep a log of blood pressures at home as she was hypertensive during her last hospitalization.  I was informed home blood pressures are usually 130/70 mmHg.  Patient is scheduled to be on dual antiplatelet therapy for the first 3 weeks followed by Plavix 75 mg p.o. daily thereafter.  FINAL MEDICATION LIST END OF ENCOUNTER: No orders of the defined  types were placed in  this encounter.   There are no discontinued medications.   Current Outpatient Medications:    aspirin EC 81 MG tablet, Take 1 tablet (81 mg total) by mouth daily. Swallow whole. For 3 months, Disp: 90 tablet, Rfl: 0   b complex vitamins capsule, Take 1 capsule by mouth daily., Disp: , Rfl:    calcium carbonate (OS-CAL - DOSED IN MG OF ELEMENTAL CALCIUM) 1250 (500 Ca) MG tablet, Take 1 tablet by mouth., Disp: , Rfl:    clopidogrel (PLAVIX) 75 MG tablet, Take 1 tablet (75 mg total) by mouth daily., Disp: 30 tablet, Rfl: 3   rosuvastatin (CRESTOR) 20 MG tablet, Take 1 tablet (20 mg total) by mouth daily., Disp: 30 tablet, Rfl: 2  Orders Placed This Encounter  Procedures   EKG 12-Lead    There are no Patient Instructions on file for this visit.   --Continue cardiac medications as reconciled in final medication list. --Return in about 26 days (around 07/29/2022) for Follow up suture removal . or sooner if needed. --Continue follow-up with your primary care physician regarding the management of your other chronic comorbid conditions.  Patient's questions and concerns were addressed to her satisfaction. She voices understanding of the instructions provided during this encounter.   This note was created using a voice recognition software as a result there may be grammatical errors inadvertently enclosed that do not reflect the nature of this encounter. Every attempt is made to correct such errors.  Rex Kras, [place], [facility]  Pager: [phone] Office: [phone]

## 2022-07-17 ENCOUNTER — Other Ambulatory Visit: Payer: Self-pay

## 2022-07-17 ENCOUNTER — Encounter: Payer: Self-pay | Admitting: Physical Therapy

## 2022-07-17 ENCOUNTER — Ambulatory Visit: Payer: Medicare Other | Attending: Family Medicine | Admitting: Physical Therapy

## 2022-07-17 ENCOUNTER — Encounter: Payer: Self-pay | Admitting: Speech Pathology

## 2022-07-17 ENCOUNTER — Ambulatory Visit: Payer: Medicare Other | Admitting: Speech Pathology

## 2022-07-17 VITALS — BP 171/78 | HR 60

## 2022-07-17 DIAGNOSIS — R2689 Other abnormalities of gait and mobility: Secondary | ICD-10-CM | POA: Diagnosis present

## 2022-07-17 DIAGNOSIS — M6281 Muscle weakness (generalized): Secondary | ICD-10-CM | POA: Insufficient documentation

## 2022-07-17 DIAGNOSIS — R2681 Unsteadiness on feet: Secondary | ICD-10-CM | POA: Insufficient documentation

## 2022-07-17 DIAGNOSIS — R471 Dysarthria and anarthria: Secondary | ICD-10-CM | POA: Diagnosis present

## 2022-07-17 NOTE — Therapy (Signed)
OUTPATIENT SPEECH LANGUAGE PATHOLOGY EVALUATION   Patient Name: Katrina Rios MRN: [id number] DOB:1936/06/07, 86 y.o., female Today's Date: 07/17/2022  PCP: Kaleen Mask, MD REFERRING PROVIDER: Kaleen Mask, MD    Past Medical History:  Diagnosis Date   Closed fracture of right distal radius    HLD (hyperlipidemia)    Stroke [facility])    Past Surgical History:  Procedure Laterality Date   ABDOMINAL HYSTERECTOMY     COLONOSCOPY     OPEN REDUCTION INTERNAL FIXATION (ORIF) DISTAL RADIAL FRACTURE Right 01/27/2021   Procedure: OPEN REDUCTION INTERNAL FIXATION (ORIF) DISTAL RADIAL FRACTURE;  Surgeon: Ernest Mallick, MD;  Location: [facility];  Service: Orthopedics;  Laterality: Right;   TONSILLECTOMY     Patient Active Problem List   Diagnosis Date Noted   Dysarthria 06/23/2022   Weakness of right upper extremity 06/23/2022   Acute ischemic stroke (HCC) 06/23/2022   Stroke (HCC) 06/23/2022   HLD (hyperlipidemia)    Dehydration    Acute prerenal azotemia    Normocytic anemia    TIA (transient ischemic attack) 06/22/2022    ONSET DATE: 06-22-22  REFERRING DIAG: s/p stroke  THERAPY DIAG:  No diagnosis found.  Rationale for Evaluation and Treatment Rehabilitation  SUBJECTIVE:   SUBJECTIVE STATEMENT: "I feel it when I'm tired" re: speech Pt accompanied by: self  PERTINENT HISTORY: hospitalized at [facility] in September 2023 she presented to the ED with right upper extremity weakness, slurred speech.  The symptoms resolved within 1-2 hours.  She was hypertensive on arrival.  CT did not show any acute intracranial findings.  MRI of the brain noted scattered small acute infarcts in the right MCA distribution without hemorrhage or mass effect.  PAIN:  Are you having pain? No  FALLS: Has patient fallen in last 6 months?  No  LIVING ENVIRONMENT: Lives with: lives with their spouse Lives in: House/apartment  PLOF:  Level of assistance:  Independent with IADLs Employment: Retired  PATIENT GOALS: "to return to piano and lawn care"   OBJECTIVE:   DIAGNOSTIC FINDINGS: MR BRAIN IMPRESSION: 1. Scattered small acute infarcts in the right MCA distribution without hemorrhage or mass effect. 2. Background chronic white matter microangiopathy and small remote lacunar infarct in the right cerebellar hemisphere.  COGNITION: Overall cognitive status: Within functional limits for tasks assessed Comments: Pt denies changes in cognition since stroke, has returned to activities not limited by physical impairments  LANGUAGE:  Overall verbal expression: within functional limits  Overall receptive language: within functional limits  Comments: denies changes in verbal expression or auditory comprehension. Appears to be within functional limits through evaluation with pt demonstrating cohesive discourse, appropriate attention and topic maintenance. Pragmatics intact.   MOTOR SPEECH: Overall motor speech:  mild impairment  Level of impairment: Conversation Respiration: thoracic breathing Phonation: low vocal intensity Resonance: WFL Articulation: Appears intact Intelligibility: Intelligible Motor planning: Appears intact Conversational loudness average: 67 dB Conversational loudness range: 64-71 dB Voice quality: normal  ORAL MOTOR EXAMINATION: Overall status: WFL  Pt does not report difficulty with swallowing; no further dysphagia evaluation indicated.   PATIENT REPORTED OUTCOME MEASURES (PROM): Deferred d/t no pt complaints for swallowing, speech, language or cognition  TODAY'S TREATMENT: 07-17-22: SLP provided education on dysarthria strategies and compensations. Discussion on pt's concerns and goals. At this time pt's primary concern is her physical limitations inhibiting preferred activities of piano and yard maintenance. SLP provided education on focused practice to improve piano playing abilities, as pt already reporting  improvement with practice. Education on lifestyle factors known to optimize brain health. Pt verbalizes understanding, denies questions at conclusion of evaluation.   PATIENT EDUCATION: Education details: see "today's treatment"  Person educated: Patient Education method: Explanation and Demonstration Education comprehension: verbalized understanding  HOME EXERCISE PROGRAM: Practice meaningful activities   ASSESSMENT:  CLINICAL IMPRESSION: Patient is a 86 y.o. F who was seen today for cognitive linguistic evaluation s/p stroke. Pt reporting mild change in speech production, limited to when fatigued. Denies communication breakdowns resulting from impairment. Overall not concerned re: speech production at this time. Fully intelligible this date with only low vocal intensity noted. Denies changes in swallowing, language, or cognition. No ST is indicated at this time.    PLAN:  SLP FREQUENCY: one time visit   Su Monks, [place] 07/17/2022, 12:28 PM

## 2022-07-17 NOTE — Therapy (Unsigned)
OUTPATIENT PHYSICAL THERAPY NEURO EVALUATION   Patient Name: Katrina Rios MRN: [id number] DOB:Nov 05, 1935, 86 y.o., female Today's Date: 07/17/2022   PCP: Leonard Downing, MD REFERRING PROVIDER: Leonard Downing, MD   PT End of Session - 07/17/22 1331     Visit Number 1    Authorization Type MEDICARE PART A AND B    PT Start Time 1323   PT ran over w/ pt prior   PT Stop Time 1402    PT Time Calculation (min) 39 min    Activity Tolerance Patient tolerated treatment well    Behavior During Therapy WFL for tasks assessed/performed             Past Medical History:  Diagnosis Date   Closed fracture of right distal radius    HLD (hyperlipidemia)    Stroke [facility])    Past Surgical History:  Procedure Laterality Date   ABDOMINAL HYSTERECTOMY     COLONOSCOPY     OPEN REDUCTION INTERNAL FIXATION (ORIF) DISTAL RADIAL FRACTURE Right 01/27/2021   Procedure: OPEN REDUCTION INTERNAL FIXATION (ORIF) DISTAL RADIAL FRACTURE;  Surgeon: Verner Mould, MD;  Location: [place];  Service: Orthopedics;  Laterality: Right;   TONSILLECTOMY     Patient Active Problem List   Diagnosis Date Noted   Dysarthria 06/23/2022   Weakness of right upper extremity 06/23/2022   Acute ischemic stroke ([place]) 06/23/2022   Stroke ([place]) 06/23/2022   HLD (hyperlipidemia)    Dehydration    Acute prerenal azotemia    Normocytic anemia    TIA (transient ischemic attack) 06/22/2022    ONSET DATE: 07/07/2022  REFERRING DIAG: P61.95 (ICD-10-CM) - Personal history of transient ischemic attack (TIA), and cerebral infarction without residual deficits  THERAPY DIAG:  Other abnormalities of gait and mobility  Muscle weakness (generalized)  Unsteadiness on feet  Rationale for Evaluation and Treatment Rehabilitation  SUBJECTIVE:                                                                                                                                                                                               SUBJECTIVE STATEMENT: Pt states she has remaining issues with the left fingers and hand especially when playing the piano.  She also states she has a "lazy leg" on the left where the knee does not lift as much as the right one.  Admission to [facility] on 06/22/2022 for acute CVa w/ left arm weakness and slurred speech which resolved within 2 hours.    Pt accompanied by: significant other-husband Elta [place] (is a retired Engineer, drilling)  PERTINENT HISTORY: [place], Ischemic  CVA  PAIN:  Are you having pain? No  PRECAUTIONS: Fall  WEIGHT BEARING RESTRICTIONS No  FALLS: Has patient fallen in last 6 months? No  LIVING ENVIRONMENT: Lives with: lives with their spouse Lives in: House/apartment Stairs: Yes: Internal: 12 steps; on right going up and External: 5 steps; bilateral but cannot reach both Has following equipment at home: Single point cane  PLOF: Independent  PATIENT GOALS "I just want to be able to use my hand again."  OBJECTIVE:   DIAGNOSTIC FINDINGS: MRI of the brain showed scattered small acute infarcts in the right MCA distribution without hemorrhage or mass effect.  COGNITION: Overall cognitive status: Within functional limits for tasks assessed   SENSATION: WFL  COORDINATION: LE RAMS:  WFL Heel-to-shin:  WFL bilaterally  EDEMA:  None noted in BLE  MUSCLE TONE: None noted in LLE  POSTURE: No Significant postural limitations  LOWER EXTREMITY ROM:     Active  Right Eval Left Eval  Hip flexion All WFL  Hip extension   Hip abduction   Hip adduction   Hip internal rotation   Hip external rotation   Knee flexion   Knee extension   Ankle dorsiflexion   Ankle plantarflexion   Ankle inversion   Ankle eversion    (Blank rows = not tested)  LOWER EXTREMITY MMT:    MMT Right Eval Left Eval  Hip flexion Grossly 4+/5 4-/5  Hip extension  Grossly 4+/5  Hip abduction    Hip adduction    Hip internal rotation    Hip  external rotation    Knee flexion    Knee extension    Ankle dorsiflexion    Ankle plantarflexion    Ankle inversion    Ankle eversion    (Blank rows = not tested)  BED MOBILITY:  Pt reports independence.  TRANSFERS: Assistive device utilized: None  Sit to stand: Complete Independence Stand to sit: Complete Independence Chair to chair: Complete Independence  GAIT: Gait pattern: WFL Distance walked: various clinic distances Assistive device utilized: None Level of assistance: Complete Independence Comments: WFL.  FUNCTIONAL TESTs:  5 times sit to stand: 12.81 sec no UE support 10 meter walk test: 9.44 sec no AD = 1.06 m/sec OR 3.50 ft/sec Functional gait assessment: 27/30  PATIENT SURVEYS:  FOTO Not captured.  TODAY'S TREATMENT:  N/A   PATIENT EDUCATION: Education details: PT POC and assessments used. Person educated: Patient Education method: Explanation Education comprehension: verbalized understanding   HOME EXERCISE PROGRAM: N/A  ASSESSMENT:  CLINICAL IMPRESSION: Patient is an 86 y.o. female who was seen today for physical therapy evaluation and treatment for stroke-like episode.  She has a PMH significant for hyperlipidemia.  Her remaining deficits related to most recent CVA-type episode are in regards to her hand.  No gross gait or LE abnormalities noted and fall risk assessed via , 5xSTS, and FGA was captured as low.  She does not require skilled PT services at this time, but PT made a point to have pt schedule OT evaluation at end of assessment this visit as OT referral present in EMR.   CLINICAL DECISION MAKING: Stable/uncomplicated  EVALUATION COMPLEXITY: Low  PLAN: PT FREQUENCY: one time visit  PT DURATION: other: one time visit  Sadie Haber, PT, DPT 07/17/2022, 2:02 PM

## 2022-07-22 ENCOUNTER — Encounter: Payer: Medicare Other | Admitting: Occupational Therapy

## 2022-07-24 ENCOUNTER — Encounter: Payer: Self-pay | Admitting: Cardiology

## 2022-07-24 ENCOUNTER — Ambulatory Visit: Payer: Medicare Other | Admitting: Cardiology

## 2022-07-24 VITALS — BP 157/79 | HR 75 | Resp 16 | Ht 68.0 in | Wt 124.0 lb

## 2022-07-24 DIAGNOSIS — I639 Cerebral infarction, unspecified: Secondary | ICD-10-CM

## 2022-07-24 DIAGNOSIS — E782 Mixed hyperlipidemia: Secondary | ICD-10-CM

## 2022-07-24 DIAGNOSIS — G459 Transient cerebral ischemic attack, unspecified: Secondary | ICD-10-CM

## 2022-07-24 NOTE — Progress Notes (Signed)
  Prodedure performed: Insertion of Biotronik Loop Recorder. Serial # [id number]  Indication: Stroke -monitor for A-fib  PHYSICAL EXAM:    07/24/2022   10:45 AM 07/17/2022    1:27 PM 07/03/2022   12:46 PM  Vitals with BMI  Height 5\' 8"   5\' 8"   Weight 124 lbs  125 lbs  BMI 58.52  77.82  Systolic 423 536 144  Diastolic 79 78 82  Pulse 75 60 72   General: Age-appropriate, hemodynamically stable, no acute distress HEENT: Normal cephalic, atraumatic, no JVP, trachea midline no carotid bruits. Lungs: Clear to auscultation bilaterally no wheezes rales or rhonchi's. Heart: Regular, positive S1-S2, no murmurs rubs or gallops appreciated. Abdomen: Soft, nontender, nondistended, positive bowel sounds in all 4 quadrants Extremities: No pitting edema, warm to touch  After obtaining informed consent, explaining the procedure to the patient, under sterile precautions, local anesthesia with 1% lidocaine with epinephrine was injected subcutaneously in the left parasternal region.  20 mL utilized.  A small nick was made in the left paraspinal region at the 3rd intercostal space. Biotronik loop recorder was inserted without any complications.  Patient tolerated the procedure well.  The incision was closed with 3 sutures, followed by steri-Strips, gauze.  There is no blood loss, no hematoma.  Written instrtuctions regarding wound care given to the patient.  Device setting:  R wave amplitude 0.8 mV.  Programmed to AF detection to Medium.  HVR 180/min. Bradycardia 30 BPM Sudden rate drop: 50% Asystole 3 Sec    ICD-10-CM   1. Acute ischemic stroke (HCC)  I63.9     2. TIA (transient ischemic attack)  G45.9     3. Mixed hyperlipidemia  E78.2      Continue cardiac medications as reconciled in final medication list. Return in about 1 week (around 07/31/2022) for Suture removal. Or sooner if needed.  Rex Kras, [place], [facility]  Pager: [phone] Office: [phone]

## 2022-07-30 ENCOUNTER — Encounter: Payer: Self-pay | Admitting: Occupational Therapy

## 2022-07-30 ENCOUNTER — Ambulatory Visit: Payer: Medicare Other | Attending: Family Medicine | Admitting: Occupational Therapy

## 2022-07-30 DIAGNOSIS — R2689 Other abnormalities of gait and mobility: Secondary | ICD-10-CM | POA: Insufficient documentation

## 2022-07-30 DIAGNOSIS — R471 Dysarthria and anarthria: Secondary | ICD-10-CM | POA: Insufficient documentation

## 2022-07-30 DIAGNOSIS — M6281 Muscle weakness (generalized): Secondary | ICD-10-CM | POA: Diagnosis not present

## 2022-07-30 DIAGNOSIS — R2681 Unsteadiness on feet: Secondary | ICD-10-CM | POA: Insufficient documentation

## 2022-07-30 DIAGNOSIS — R278 Other lack of coordination: Secondary | ICD-10-CM | POA: Insufficient documentation

## 2022-07-30 NOTE — Therapy (Signed)
OUTPATIENT OCCUPATIONAL THERAPY NEURO EVALUATION  Patient Name: Katrina Rios MRN: [id number] DOB:April 15, 1936, 86 y.o., female Today's Date: 07/31/2022  PCP: Kaleen Mask, MD REFERRING PROVIDER: Kaleen Mask, MD   OT End of Session - 07/31/22 1134     Visit Number 1    Number of Visits 9    Date for OT Re-Evaluation 10/23/21    Authorization Type Medicare    Authorization Time Period 12 weeks    Authorization - Visit Number 1    Progress Note Due on Visit 9    OT Start Time 1402    OT Stop Time 1444    OT Time Calculation (min) 42 min    Activity Tolerance Patient tolerated treatment well    Behavior During Therapy WFL for tasks assessed/performed             Past Medical History:  Diagnosis Date   Closed fracture of right distal radius    HLD (hyperlipidemia)    Stroke [facility])    Past Surgical History:  Procedure Laterality Date   ABDOMINAL HYSTERECTOMY     COLONOSCOPY     OPEN REDUCTION INTERNAL FIXATION (ORIF) DISTAL RADIAL FRACTURE Right 01/27/2021   Procedure: OPEN REDUCTION INTERNAL FIXATION (ORIF) DISTAL RADIAL FRACTURE;  Surgeon: Ernest Mallick, MD;  Location: [facility];  Service: Orthopedics;  Laterality: Right;   TONSILLECTOMY     Patient Active Problem List   Diagnosis Date Noted   Dysarthria 06/23/2022   Weakness of right upper extremity 06/23/2022   Acute ischemic stroke (HCC) 06/23/2022   Stroke (HCC) 06/23/2022   HLD (hyperlipidemia)    Dehydration    Acute prerenal azotemia    Normocytic anemia    TIA (transient ischemic attack) 06/22/2022    ONSET DATE: 07/07/2022   REFERRING DIAG: J09.32 (ICD-10-CM) - Personal history of transient ischemic attack (TIA), and cerebral infarction without residual deficits  THERAPY DIAG:  Muscle weakness (generalized)  Other abnormalities of gait and mobility  Unsteadiness on feet  Other lack of coordination  Rationale for Evaluation and Treatment:  Rehabilitation  SUBJECTIVE:   SUBJECTIVE STATEMENT: Pt plays the piano and has difficulty playing notes accurately with her L hand. She states she is playing the piano 25% as well as she did prior to the stroke. She has found issue dropping items from her hand and knowing where her hand is in relation to the piano. She reports she may have difficulty coming as frequently as her POC is written as she will rely on her husband for transportation, and he is currently working.   Pt accompanied by: self  PERTINENT HISTORY: Per hospital d/c note, "Patient is a 86 year old female with history of hyperlipidemia but not on any medication presented with complaint of right upper extremity weakness that started suddenly on the same day of admission.  There was also report that she developed some slurred speech briefly which resolved within 1 to 2 hours.  No problem with ambulation.  No history of previous stroke.  Husband is a retired Development worker, community.  On presentation, she was hypertensive.  CT head did not show any acute intracranial process.  Her right upper extremity weakness resolved.  MRI of the brain showed scattered small acute infarcts in the right MCA distribution without hemorrhage or mass effect."  Broke her R wrist playing basketball 1 year ago and had utilized L hand more frequently because of that.   PRECAUTIONS: Fall   WEIGHT BEARING RESTRICTIONS No   FALLS:  Has patient fallen in last 6 months? No   LIVING ENVIRONMENT: Lives with: lives with their spouse Lives in: House/apartment Stairs: Yes: Internal: 12 steps; on right going up and External: 5 steps; bilateral but cannot reach both Has following equipment at home: Single point cane   PLOF: Independent   PATIENT GOALS: Return to playing piano at PLOF  OBJECTIVE:   HAND DOMINANCE: Right  ADLs: Overall ADLs:  Eating: independent Grooming: independent UB Dressing: independent LB Dressing: independent Toileting:  independent Bathing: independent Tub Shower transfers: independent  IADLs: Shopping: online shopping, at [employer]: husband has been completing for her, this is especially due to reported fatigue; has housekeepers to come in every 2 weeks at baseline; has started to make the bed  Meal Prep: husband has been completing Community mobility: husband is driving Medication management: setup from husband Financial management: husband completed at baseline Handwriting:  N/A as pt is R handed  MOBILITY STATUS: Independent  ACTIVITY TOLERANCE: Activity tolerance: poor; pt reports having to take numerous rest breaks for task completion.   UPPER EXTREMITY ROM:     AROM Right (eval) Left (eval)  Shoulder flexion WNL WNL  Shoulder abduction WNL WNL  Elbow flexion WNL WNL  Elbow extension WNL WNL  Wrist flexion WNL WNL  Wrist extension WNL WNL  Wrist pronation WNL WNL  Wrist supination WNL WNL   Digit Composite Flexion WNL WNL  Digit Composite Extension WNL WNL  Digit Opposition WNL        WNL  (Blank rows = not tested)  UPPER EXTREMITY MMT:     MMT Right (eval) Left (eval)  Shoulder flexion WNL WNL  Shoulder abduction WNL WNL  Elbow flexion WNL WNL  Elbow extension WNL WNL  (Blank rows = not tested)  HAND FUNCTION: Grip strength: Right: 34.8 lbs; Left: 30.8 lbs  COORDINATION: Finger Nose Finger test: bradykinesia with errors 9 Hole Peg test: Right: 26 sec; Left: 27 sec  SENSATION: WFL; though had numbness and tingling initially  EDEMA: none  MUSCLE TONE: LUE: Within functional limits  COGNITION: Overall cognitive status: Within functional limits for tasks assessed  VISION: Subjective report: reports eye fatigue at baseline Baseline vision: Wears glasses for reading only; rx  Visual history: cataracts  VISION ASSESSMENT: WFL  PERCEPTION: WFL  PRAXIS: WFL  OBSERVATIONS: Pt ambulates independently without use of AD. No LOB. Pt well-kept but  appears lethargic.    TODAY'S TREATMENT:                         - Neuro re-education completed for duration as noted below including:                                                                                                         OT initiated LUE coordination HOME EXERCISE PROGRAM as noted in patient instructions including pick up and placement of items, shuffling and turning cards, item stacking and unstacking, rolling a piece of tissue paper into a ball, rolling golf balls in hand, rolling a  pen in between fingers and thumb, picking up and storing items in hand, and then translating stored items to tips of thumb and index finger before placing them individually into a container.   PATIENT EDUCATION: Education details: Artist Person educated: Patient Education method: Explanation, Demonstration, and Handouts Education comprehension: verbalized understanding and needs further education  HOME EXERCISE PROGRAM: 07/30/2022 - LUE coordination   GOALS:  SHORT TERM GOALS: Target date: 08/28/2022   Patient will demonstrate LUE coordination HEP independently. Baseline: N/A Goal status: INITIAL  2.  Pt will be able to independently recall at least 2 energy conservation strategies to improve independence and safety within the home.  Baseline:  Goal status: INITIAL  LONG TERM GOALS: Target date: 10/23/2021    Pt will demonstrate advanced LUE HEP independently. Baseline:  Goal status: INITIAL  2.  Pt will report using LUE to play piano at 80% of prior level of function or more.  Baseline: 25% Goal status: INITIAL  3.  Pt will be able to complete dishes with up to 2 rest breaks as needed in efforts to return to PLOF.  Baseline: husband has been completing for her. Goal status: INITIAL  4.  Pt will be able to carry 5 lb item at least 94' with LUE to improve independence and safety with home task completion.  Baseline: Pt reports she drops things frequently from L  hand Goal status: INITIAL  ASSESSMENT:  CLINICAL IMPRESSION: Patient is a 86 y.o. female who was seen today for occupational therapy evaluation for TIA (07/07/2022). Hx includes HLD, R wrist fracture, anemia, and CVA. Patient currently presents below baseline level of functioning demonstrating functional deficits and impairments as noted below. Pt would benefit from skilled OT services in the outpatient setting to work on impairments as noted below to help pt return to PLOF as able.    PERFORMANCE DEFICITS: in functional skills including ADLs, IADLs, coordination, proprioception, sensation, strength, Fine motor control, Gross motor control, endurance, and UE functional use  IMPAIRMENTS: are limiting patient from ADLs, IADLs, and leisure.   CO-MORBIDITIES: may have co-morbidities  that affects occupational performance. Patient will benefit from skilled OT to address above impairments and improve overall function.  MODIFICATION OR ASSISTANCE TO COMPLETE EVALUATION: No modification of tasks or assist necessary to complete an evaluation.  OT OCCUPATIONAL PROFILE AND HISTORY: Problem focused assessment: Including review of records relating to presenting problem.  CLINICAL DECISION MAKING: LOW - limited treatment options, no task modification necessary  REHAB POTENTIAL: Good  EVALUATION COMPLEXITY: Low    PLAN:  OT FREQUENCY: 1x/week  OT DURATION: 12 weeks (anticipate d/c in 6 weeks)  PLANNED INTERVENTIONS: self care/ADL training, therapeutic exercise, therapeutic activity, neuromuscular re-education, manual therapy, passive range of motion, functional mobility training, ultrasound, paraffin, fluidotherapy, moist heat, contrast bath, patient/family education, visual/perceptual remediation/compensation, energy conservation, coping strategies training, DME and/or AE instructions, and Re-evaluation  RECOMMENDED OTHER SERVICES: N/A   CONSULTED AND AGREED WITH PLAN OF CARE: Patient  PLAN  FOR NEXT SESSION: Review and progress coordination HEP as needed; energy conservation techniques; endurance training with completion of functional tasks   Dennis Bast, OT 07/31/2022, 1:52 PM

## 2022-07-30 NOTE — Patient Instructions (Signed)
  Coordination Activities  Perform the following activities for 10 minutes 2-4 times per day with left hand(s).  Rotate ball in fingertips (clockwise and counter-clockwise). Pick up coins, buttons, marbles, dried beans/pasta of different sizes and place in container. Pick up coins and stack. Pick up coins one at a time until you get 5-10 in your hand, then move coins from palm to fingertips to stack one at a time. Twirl pen between fingers. Practice writing and/or typing. Roll up a piece of paper towel or tissue into a small ball using all fingers.

## 2022-07-31 ENCOUNTER — Encounter: Payer: Self-pay | Admitting: Cardiology

## 2022-07-31 ENCOUNTER — Ambulatory Visit: Payer: Medicare Other | Admitting: Cardiology

## 2022-07-31 VITALS — BP 176/76 | HR 64 | Resp 14 | Ht 68.0 in | Wt 124.8 lb

## 2022-07-31 DIAGNOSIS — I10 Essential (primary) hypertension: Secondary | ICD-10-CM

## 2022-07-31 DIAGNOSIS — G459 Transient cerebral ischemic attack, unspecified: Secondary | ICD-10-CM

## 2022-07-31 DIAGNOSIS — E782 Mixed hyperlipidemia: Secondary | ICD-10-CM

## 2022-07-31 DIAGNOSIS — I639 Cerebral infarction, unspecified: Secondary | ICD-10-CM

## 2022-07-31 MED ORDER — AMLODIPINE BESYLATE 5 MG PO TABS: 5.0000 mg | ORAL_TABLET | Freq: Every morning | ORAL | 0 refills | Status: DC

## 2022-07-31 NOTE — Progress Notes (Signed)
   Loop Recorder Implant follow up.   Patient came in for suture removal after recently undergoing Biotronik Loop Recorder on 07/25/2027.   The site is clean, dry, healing well.   Suture removed and bandage applied.   No signs of infection.   Discussed wound care w/ patient and her husband.  Office blood pressures are not well controlled.  Her husband who is a retired Administrator, Civil Service does check her blood pressures at home and they are usually running between 130-140 mmHg.   Shared decision was to start antihypertensive medications.  We will start with amlodipine 5 mg p.o. daily and see how she tolerates it.  If the numbers are still not well controlled further medication titration could be considered either by myself or her PCP.      ICD-10-CM   1. Benign hypertension  I10 amLODipine (NORVASC) 5 MG tablet    2. TIA (transient ischemic attack)  G45.9     3. Acute ischemic stroke (HCC)  I63.9     4. Mixed hyperlipidemia  E78.2      No charge.   Rex Kras, [place], [facility]  Pager: [phone] Office: [phone]

## 2022-08-04 ENCOUNTER — Encounter: Payer: Medicare Other | Admitting: Occupational Therapy

## 2022-08-06 ENCOUNTER — Encounter: Payer: Self-pay | Admitting: Occupational Therapy

## 2022-08-06 ENCOUNTER — Ambulatory Visit: Payer: Medicare Other | Admitting: Occupational Therapy

## 2022-08-06 DIAGNOSIS — R2689 Other abnormalities of gait and mobility: Secondary | ICD-10-CM

## 2022-08-06 DIAGNOSIS — M6281 Muscle weakness (generalized): Secondary | ICD-10-CM | POA: Diagnosis not present

## 2022-08-06 DIAGNOSIS — R471 Dysarthria and anarthria: Secondary | ICD-10-CM

## 2022-08-06 DIAGNOSIS — R278 Other lack of coordination: Secondary | ICD-10-CM

## 2022-08-06 DIAGNOSIS — R2681 Unsteadiness on feet: Secondary | ICD-10-CM

## 2022-08-06 NOTE — Therapy (Signed)
OUTPATIENT OCCUPATIONAL THERAPY NEURO TREATMENT  Patient Name: Katrina Rios MRN: [id number] DOB:09-29-1935, 86 y.o., female Today's Date: 07/31/2022  PCP: Kaleen Mask, MD REFERRING PROVIDER: Kaleen Mask, MD   OT End of Session - 07/31/22 1134     Visit Number 1    Number of Visits 9    Date for OT Re-Evaluation 10/23/21    Authorization Type Medicare    Authorization Time Period 12 weeks    Authorization - Visit Number 1    Progress Note Due on Visit 9    OT Start Time 1402    OT Stop Time 1444    OT Time Calculation (min) 42 min    Activity Tolerance Patient tolerated treatment well    Behavior During Therapy WFL for tasks assessed/performed             Past Medical History:  Diagnosis Date   Closed fracture of right distal radius    HLD (hyperlipidemia)    Stroke [facility])    Past Surgical History:  Procedure Laterality Date   ABDOMINAL HYSTERECTOMY     COLONOSCOPY     OPEN REDUCTION INTERNAL FIXATION (ORIF) DISTAL RADIAL FRACTURE Right 01/27/2021   Procedure: OPEN REDUCTION INTERNAL FIXATION (ORIF) DISTAL RADIAL FRACTURE;  Surgeon: Ernest Mallick, MD;  Location: [facility];  Service: Orthopedics;  Laterality: Right;   TONSILLECTOMY     Patient Active Problem List   Diagnosis Date Noted   Dysarthria 06/23/2022   Weakness of right upper extremity 06/23/2022   Acute ischemic stroke (HCC) 06/23/2022   Stroke (HCC) 06/23/2022   HLD (hyperlipidemia)    Dehydration    Acute prerenal azotemia    Normocytic anemia    TIA (transient ischemic attack) 06/22/2022    ONSET DATE: 07/07/2022   REFERRING DIAG: I50.38 (ICD-10-CM) - Personal history of transient ischemic attack (TIA), and cerebral infarction without residual deficits  THERAPY DIAG:  Muscle weakness (generalized)  Other abnormalities of gait and mobility  Unsteadiness on feet  Other lack of coordination  Rationale for Evaluation and Treatment:  Rehabilitation  SUBJECTIVE:   SUBJECTIVE STATEMENT: She has not dropped items from her left hand but she is still working on hitting the correct keys when playing the piano with her L hand. She has been completing [email or web address] exercises and really enjoys them.   Pt accompanied by: self  PERTINENT HISTORY: Per hospital d/c note, "Patient is a 86 year old female with history of hyperlipidemia but not on any medication presented with complaint of right upper extremity weakness that started suddenly on the same day of admission.  There was also report that she developed some slurred speech briefly which resolved within 1 to 2 hours.  No problem with ambulation.  No history of previous stroke.  Husband is a retired Development worker, community.  On presentation, she was hypertensive.  CT head did not show any acute intracranial process.  Her right upper extremity weakness resolved.  MRI of the brain showed scattered small acute infarcts in the right MCA distribution without hemorrhage or mass effect."  Broke her R wrist playing basketball 1 year ago and had utilized L hand more frequently because of that.   PRECAUTIONS: Fall   WEIGHT BEARING RESTRICTIONS No   FALLS: Has patient fallen in last 6 months? No   LIVING ENVIRONMENT: Lives with: lives with their spouse Lives in: House/apartment Stairs: Yes: Internal: 12 steps; on right going up and External: 5 steps; bilateral but cannot reach both Has following equipment  at home: Single point cane   PLOF: Independent   PATIENT GOALS: Return to playing piano at PLOF  OBJECTIVE: (from evaluation unless otherwise noted)  HAND DOMINANCE: Right  IADLs: Shopping: online shopping, at [employer] housekeeping: husband has been completing for her, this is especially due to reported fatigue; has housekeepers to come in every 2 weeks at baseline; has started to make the bed  Meal Prep: husband has been completing Community mobility: husband is driving Medication  management: setup from husband Financial management: husband completed at baseline Handwriting:  N/A as pt is R handed  ACTIVITY TOLERANCE: Activity tolerance: poor; pt reports having to take numerous rest breaks for task completion.   UPPER EXTREMITY ROM:     AROM Right (eval) Left (eval)  Shoulder flexion WNL WNL  Shoulder abduction WNL WNL  Elbow flexion WNL WNL  Elbow extension WNL WNL  Wrist flexion WNL WNL  Wrist extension WNL WNL  Wrist pronation WNL WNL  Wrist supination WNL WNL   Digit Composite Flexion WNL WNL  Digit Composite Extension WNL WNL  Digit Opposition WNL        WNL  (Blank rows = not tested)  UPPER EXTREMITY MMT:     MMT Right (eval) Left (eval)  Shoulder flexion WNL WNL  Shoulder abduction WNL WNL  Elbow flexion WNL WNL  Elbow extension WNL WNL  (Blank rows = not tested)  HAND FUNCTION: Grip strength: Right: 34.8 lbs; Left: 30.8 lbs  COORDINATION: Finger Nose Finger test: bradykinesia with errors 9 Hole Peg test: Right: 26 sec; Left: 27 sec  SENSATION: WFL; though had numbness and tingling initially  COGNITION: Overall cognitive status: Within functional limits for tasks assessed  VISION: Subjective report: reports eye fatigue at baseline Baseline vision: Wears glasses for reading only; rx  Visual history: cataracts  VISION ASSESSMENT: WFL  PERCEPTION: WFL  PRAXIS: WFL  OBSERVATIONS: Pt ambulates independently without use of AD. No LOB. Pt well-kept but appears lethargic. Good understanding of HEP though requires increased time for completion.    TODAY'S TREATMENT:                         - Neuro re-education completed for duration as noted below including:                                                                                                         OT reviewed LUE coordination HOME EXERCISE PROGRAM as noted in patient instructions including pick up and placement of items, shuffling and turning cards, item stacking  and unstacking, rolling a piece of tissue paper into a ball, rolling golf balls in hand, rolling a pen in between fingers and thumb, picking up and storing items in hand, and then translating stored items to tips of thumb and index finger before placing them individually into a container.   Patient participated in 1 game of dominoes requiring min verbal cues for proper play, mod verbal cues to only use affected L hand with manipulation of dominoes, and increased time for  manipulation of dominoes as desired for fine motor coordination  Wrist flex and ext with yellow flex bar x 2 min for strength and endurance of affected extremity  Supination with yellow flex bar x 2 min for strength and endurance of affected extremity  pronation with yellow flex bar x 2 min for strength and endurance of affected extremity  - Self-care/home management completed for duration as noted below including: OT educated patient on the 4 Ps of energy conservation including positioning, pace, prioritizing, and planning to maximize efficiency and safety with completion of functional activities as desired.  Patient verbalized understanding of all information provided and was given a packet to take home for review.  PATIENT EDUCATION: Education details: Patent attorney; EC techniques Person educated: Patient Education method: Explanation, Demonstration, and Handouts Education comprehension: verbalized understanding and needs further education  HOME EXERCISE PROGRAM: 07/30/2022 - LUE coordination 08/06/2022 - EC techniques   GOALS:  SHORT TERM GOALS: Target date: 08/28/2022   Patient will demonstrate LUE coordination HEP independently. Baseline: N/A Goal status: INITIAL  2.  Pt will be able to independently recall at least 2 energy conservation strategies to improve independence and safety within the home.  Baseline:  Goal status: INITIAL  LONG TERM GOALS: Target date: 10/23/2021    Pt will demonstrate advanced  LUE HEP independently. Baseline:  Goal status: INITIAL  2.  Pt will report using LUE to play piano at 80% of prior level of function or more.  Baseline: 25% Goal status: INITIAL  3.  Pt will be able to complete dishes with up to 2 rest breaks as needed in efforts to return to PLOF.  Baseline: husband has been completing for her. Goal status: INITIAL  4.  Pt will be able to carry 5 lb item at least 50' with LUE to improve independence and safety with home task completion.  Baseline: Pt reports she drops things frequently from L hand Goal status: INITIAL  ASSESSMENT:  CLINICAL IMPRESSION: Patient demonstrates good understanding and carryover of HEP. She would benefit from further skilled OT services to improve LUE functional use as well as adaptive strategies for IADL completion secondary to poor endurance. Encouraged pt to discuss reported dizziness and fatigue with PCP again.   PERFORMANCE DEFICITS: in functional skills including ADLs, IADLs, coordination, proprioception, sensation, strength, Fine motor control, Gross motor control, endurance, and UE functional use  IMPAIRMENTS: are limiting patient from ADLs, IADLs, and leisure.   CO-MORBIDITIES: may have co-morbidities  that affects occupational performance. Patient will benefit from skilled OT to address above impairments and improve overall function.   PLAN:  OT FREQUENCY: 1x/week  OT DURATION: 12 weeks (anticipate d/c in 6 weeks)  PLANNED INTERVENTIONS: self care/ADL training, therapeutic exercise, therapeutic activity, neuromuscular re-education, manual therapy, passive range of motion, functional mobility training, ultrasound, paraffin, fluidotherapy, moist heat, contrast bath, patient/family education, visual/perceptual remediation/compensation, energy conservation, coping strategies training, DME and/or AE instructions, and Re-evaluation  RECOMMENDED OTHER SERVICES: N/A   CONSULTED AND AGREED WITH PLAN OF CARE:  Patient  PLAN FOR NEXT SESSION: LUE coordination; review energy conservation techniques; endurance training with completion of functional tasks   Delana Meyer, OT 07/31/2022, 1:52 PM

## 2022-08-07 NOTE — Patient Instructions (Signed)
  1    Energy Conservation  What is Energy Conservation?  After being in the hospital, it is normal to feel tired and weak. You may also feel short of breath and have less energy to do the activities you are used to doing at home. Learning how to conserve your energy helps you build up your strength to take part in your daily activities and other things you enjoy doing.  When you learn to conserve energy, you also reduce strain on your heart, fatigue, shortness of breath and stress related pain.  Learning to conserve your energy is all about finding a good balance between work, rest and leisure in order to decrease the amount of energy demand on your body.   Remember and Practice the 4 Ps  1. Prioritize  Decide what needs to be done today and what can be done at a later date or time. For example, going to a doctor's appointment would take priority over dusting the living room.  When you have more than one thing to do, begin with the most important to make sure it gets done.   2. Plan  Plan your activities first to avoid extra trips. Gather the supplies and  equipment you need before doing the job. For example, get your  garden supplies and tools ready before you start to plant flowers.  Plan to alternate heavy and light tasks.  Plan activities throughout the week to avoid doing too many activities in one day. Put a schedule on the refrigerator to remind you and others who is doing what.  Plan to get a good rest each night.  Use family and friends or pay for help to complete tasks you may struggle with or that require too much energy.   3. Pace  Maintain a slow and steady pace. Never rush.  2   3. Pace (continued)  Rest often. Rest before you feel tired.  Avoid holding your breath. Practice breathing slow and steady.  Use pursed lip breathing. Breathe in through your nose for a count of 2 and out from your mouth for a count of 4. This is like blowing out a candle on a cake.  Remember  that you may have to ask for help to do some tasks and that is okay.  Listen to your body and know your limits.   4. Position  Too much bending and reaching can cause fatigue and shortness of breath. Use a reacher, sock aid, long handled shoe horn and/or  elastic shoelaces. Avoid bending and reaching too much.  Always maintain a nice upright posture when sitting and  standing. This helps you get more oxygen into your lungs and  around your body to work better.  Sit when you can. Sitting supports your body so you can focus  on your breathing and activities while conserving your energy.  Sitting reduces energy use by 25%.   Energy Conservation Tips  Dressing and Hygiene  Sit when you can.  Organize and lay out clothing the night before.  Begin dressing your lower half first as this uses more energy.  Avoid bending and reaching. Instead, use a reacher, sock aid  or long handled shoe horn or lift your legs up onto the bed or chair.  Dry off with terry cloth robe. You use less energy than drying off with a towel.  If you have a weaker limb or limbs, it is easier to dress the weaker limb first. It is easier to undress   strong limb first.  Wear clothes that are easy to put on and take off. For example, use clothes and shoes with velcro instead of small buttons, clasps or laces.  3    Avoid using scented products such as hair products and lotions. These can irritate your lungs and cause shortness of breath for you and others around you. Many people are allergic to scents. These types of products are not allowed in the hospital.  Be cautious when bathing. Use warm, not hot water. This helps eliminate shortness of breath from a buildup of steam and condensation.  Use the bathroom equipment suggested by your Occupational Therapist. For example using a bath bench, bath stool, grab bars or a raised toilet seat can make bathing and toileting easier and safer.   Shopping  Bring a prepared list  of things you need to buy.  Organize your shopping list by aisle or section of the  store.  Transport items in a buggy or shopping cart rather than  carrying them in a basket.  Load and carry grocery bags that are only half full or shop with someone who can help pack and carry bags.  Avoid going out during rush hour when stores and streets are crowded.  Consider using a delivery service.   Housework  Divide activities and do them throughout the week. Balance light with heavy tasks.  Make one side of the bed at a time. Sit to change pillow cases and unfold linen.  Avoid spray cleaners that may irritate your lungs.  Clean the bathtub by sitting or kneeling.  Clean one whole room at a time instead of going back and forth between rooms to do each job.  Consider asking for help from family members or  hiring a cleaning service or housekeeper.  After washing dishes, allow them to air dry.  Have work in front of you rather than at your side.  Slide rather than lift objects.  Use long handled dustpans and cleaning sponges to decrease the need for bending.  4    Make a weekly plan for major jobs such as laundry, cleaning and  changing sheets on beds. Do one job each day.  Keep a trash can in every room to avoid too much walking.  Buy more than one of each item you use around the house.  For example, keep sink cleaner in the bathroom and kitchen.  Keep a vacuum on each level of your home.   Cooking  Cook and bake in steps to reduce energy use.  Gather all ingredients and utensils before starting.  Plan ahead with meal preparation.  Make large meals and freeze in servings for later use.  Use lightweight cookware and dishes to conserve energy.  Use paper plates and cups to eliminate dishwashing.  Use electric appliances such as can openers, blenders, food processors and dishwasher to conserve energy.  Consider buying easy to prepare or frozen meals, or using a meal delivery service.    Key Points:  Prioritize activities of the day. Do heavier tasks when you have more energy.  Plan your days' and weeks' activities. Set up your work area so you do not have to move around a lot looking for items to complete the task. Plan rest times.  Pace yourself. Do not try to complete the whole task in one session. Break it into smaller, easy to do steps. A good guide to follow is to take 10 minutes each hour to rest. Do not rush.  Position and Posture are important. Sit to work when you can to use 25% less energy. Sit and stand as upright as you can. Practice deep breathing exercises while you work to maintain your breathing rate and stay relaxed.  Use assistive devices when recommended to save energy and make it more comfortable and easy taking care of yourself.   Remember.  The most important energy conservation tip is to listen to your body.  Stop and rest BEFORE you get tired. Plan rest times. Rest often.

## 2022-08-24 ENCOUNTER — Encounter: Payer: Self-pay | Admitting: Neurology

## 2022-08-24 ENCOUNTER — Ambulatory Visit (INDEPENDENT_AMBULATORY_CARE_PROVIDER_SITE_OTHER): Payer: Medicare Other | Admitting: Neurology

## 2022-08-24 VITALS — BP 160/75 | HR 66 | Ht 67.0 in | Wt 127.0 lb

## 2022-08-24 DIAGNOSIS — I10 Essential (primary) hypertension: Secondary | ICD-10-CM | POA: Diagnosis not present

## 2022-08-24 DIAGNOSIS — R29898 Other symptoms and signs involving the musculoskeletal system: Secondary | ICD-10-CM | POA: Diagnosis not present

## 2022-08-24 DIAGNOSIS — E785 Hyperlipidemia, unspecified: Secondary | ICD-10-CM

## 2022-08-24 DIAGNOSIS — I639 Cerebral infarction, unspecified: Secondary | ICD-10-CM | POA: Diagnosis not present

## 2022-08-24 MED ORDER — AMLODIPINE BESYLATE 5 MG PO TABS: 5.0000 mg | ORAL_TABLET | Freq: Every morning | ORAL | 0 refills | Status: DC

## 2022-08-24 NOTE — Patient Instructions (Signed)
I had a long d/w patient  and her husband about her recent cryptogenic stroke, risk for recurrent stroke/TIAs, personally independently reviewed imaging studies and stroke evaluation results and answered questions.Continue  Plavix 75 mg daily  for secondary stroke prevention and maintain strict control of hypertension with blood pressure goal below 130/90, diabetes with hemoglobin A1c goal below 6.5% and lipids with LDL cholesterol goal below 70 mg/dL. I also advised the patient to eat a healthy diet with plenty of whole grains, cereals, fruits and vegetables, exercise regularly and maintain ideal body weight.  Check follow-up lipid profile today.  Followup in the future with me in 3 months or call earlier if needed.  Stroke Prevention Some medical conditions and behaviors can lead to a higher chance of having a stroke. You can help prevent a stroke by eating healthy, exercising, not smoking, and managing any medical conditions you have. Stroke is a leading cause of functional impairment. Primary prevention is particularly important because a majority of strokes are first-time events. Stroke changes the lives of not only those who experience a stroke but also their family and other caregivers. How can this condition affect me? A stroke is a medical emergency and should be treated right away. A stroke can lead to brain damage and can sometimes be life-threatening. If a person gets medical treatment right away, there is a better chance of surviving and recovering from a stroke. What can increase my risk? The following medical conditions may increase your risk of a stroke: Cardiovascular disease. High blood pressure (hypertension). Diabetes. High cholesterol. Sickle cell disease. Blood clotting disorders (hypercoagulable state). Obesity. Sleep disorders (obstructive sleep apnea). Other risk factors include: Being older than age 14. Having a history of blood clots, stroke, or mini-stroke (transient  ischemic attack, TIA). Genetic factors, such as race, ethnicity, or a family history of stroke. Smoking cigarettes or using other tobacco products. Taking birth control pills, especially if you also use tobacco. Heavy use of alcohol or drugs, especially cocaine and methamphetamine. Physical inactivity. What actions can I take to prevent this? Manage your health conditions High cholesterol levels. Eating a healthy diet is important for preventing high cholesterol. If cholesterol cannot be managed through diet alone, you may need to take medicines. Take any prescribed medicines to control your cholesterol as told by your health care provider. Hypertension. To reduce your risk of stroke, try to keep your blood pressure below 130/80. Eating a healthy diet and exercising regularly are important for controlling blood pressure. If these steps are not enough to manage your blood pressure, you may need to take medicines. Take any prescribed medicines to control hypertension as told by your health care provider. Ask your health care provider if you should monitor your blood pressure at home. Have your blood pressure checked every year, even if your blood pressure is normal. Blood pressure increases with age and some medical conditions. Diabetes. Eating a healthy diet and exercising regularly are important parts of managing your blood sugar (glucose). If your blood sugar cannot be managed through diet and exercise, you may need to take medicines. Take any prescribed medicines to control your diabetes as told by your health care provider. Get evaluated for obstructive sleep apnea. Talk to your health care provider about getting a sleep evaluation if you snore a lot or have excessive sleepiness. Make sure that any other medical conditions you have, such as atrial fibrillation or atherosclerosis, are managed. Nutrition Follow instructions from your health care provider about what to eat  or drink to help  manage your health condition. These instructions may include: Reducing your daily calorie intake. Limiting how much salt (sodium) you use to 1,500 milligrams (mg) each day. Using only healthy fats for cooking, such as olive oil, canola oil, or sunflower oil. Eating healthy foods. You can do this by: Choosing foods that are high in fiber, such as whole grains, and fresh fruits and vegetables. Eating at least 5 servings of fruits and vegetables a day. Try to fill one-half of your plate with fruits and vegetables at each meal. Choosing lean protein foods, such as lean cuts of meat, poultry without skin, fish, tofu, beans, and nuts. Eating low-fat dairy products. Avoiding foods that are high in sodium. This can help lower blood pressure. Avoiding foods that have saturated fat, trans fat, and cholesterol. This can help prevent high cholesterol. Avoiding processed and prepared foods. Counting your daily carbohydrate intake.  Lifestyle If you drink alcohol: Limit how much you have to: 0-1 drink a day for women who are not pregnant. 0-2 drinks a day for men. Know how much alcohol is in your drink. In the [place] one drink equals one 12 oz bottle of beer (318mL), one 5 oz glass of wine (13mL), or one 1 oz glass of hard liquor (62mL). Do not use any products that contain nicotine or tobacco. These products include cigarettes, chewing tobacco, and vaping devices, such as e-cigarettes. If you need help quitting, ask your health care provider. Avoid secondhand smoke. Do not use drugs. Activity  Try to stay at a healthy weight. Get at least 30 minutes of exercise on most days, such as: Fast walking. Biking. Swimming. Medicines Take over-the-counter and prescription medicines only as told by your health care provider. Aspirin or blood thinners (antiplatelets or anticoagulants) may be recommended to reduce your risk of forming blood clots that can lead to stroke. Avoid taking birth control pills.  Talk to your health care provider about the risks of taking birth control pills if: You are over 23 years old. You smoke. You get very bad headaches. You have had a blood clot. Where to find more information American Stroke Association: www.strokeassociation.org Get help right away if: You or a loved one has any symptoms of a stroke. "BE FAST" is an easy way to remember the main warning signs of a stroke: B - Balance. Signs are dizziness, sudden trouble walking, or loss of balance. E - Eyes. Signs are trouble seeing or a sudden change in vision. F - Face. Signs are sudden weakness or numbness of the face, or the face or eyelid drooping on one side. A - Arms. Signs are weakness or numbness in an arm. This happens suddenly and usually on one side of the body. S - Speech. Signs are sudden trouble speaking, slurred speech, or trouble understanding what people say. T - Time. Time to call emergency services. Write down what time symptoms started. You or a loved one has other signs of a stroke, such as: A sudden, severe headache with no known cause. Nausea or vomiting. Seizure. These symptoms may represent a serious problem that is an emergency. Do not wait to see if the symptoms will go away. Get medical help right away. Call your local emergency services (911 in the [place] Do not drive yourself to the hospital. Summary You can help to prevent a stroke by eating healthy, exercising, not smoking, limiting alcohol intake, and managing any medical conditions you may have. Do not use any products that  contain nicotine or tobacco. These include cigarettes, chewing tobacco, and vaping devices, such as e-cigarettes. If you need help quitting, ask your health care provider. Remember "BE FAST" for warning signs of a stroke. Get help right away if you or a loved one has any of these signs. This information is not intended to replace advice given to you by your health care provider. Make sure you discuss any  questions you have with your health care provider. Document Revised: 04/15/2020 Document Reviewed: 04/15/2020 Elsevier Patient Education  [place].

## 2022-08-24 NOTE — Progress Notes (Signed)
[facility] [address] [place]. [place] [address] [phone]       OFFICE CONSULT NOTE  Ms. Katrina Rios Date of Birth:  1936/02/10 Medical Record Number:  [id number]   Referring MD:  Osvaldo Shipper  Reason for Referral:  Stroke  HPI: Ms. Katrina Rios is a 86 year old pleasant Caucasian lady seen today for initial office consultation visit for stroke.  History is obtained from the patient and her husband as well as review of electronic medical records.  I have personally reviewed pertinent available imaging films in PACS.  She has past medical history of hyperlipidemia only.  She presented on 06/22/2022 with sudden onset of right upper extremity weakness as well as some slurred speech and difficulty holding a cup in the right hand and subsequently left hand weakness as well.  She also reported 3 weeks prior she had an episode of transient left leg weakness but did not seek medical help.  MRI scan of the brain showed small scattered right MCA embolic looking infarcts along with an old lacunar infarct in the right cerebellum.  MR angiogram of the brain shows multifocal intracranial morning right M2, left M2 and right A2 anterior cerebral artery segments.  MR angiogram of the neck shows no significant extracranial large vessel lesion.  2D echo showed ejection fraction of 60 to 65% cardiac source embolism.  Bubble study was negative.  LDL cholesterol was elevated at 109 mg% and hemoglobin A1c was 5.5.  Patient was started on aspirin and Plavix for 3 weeks followed by Plavix alone and she is tolerating it well without bruising or bleeding.  She is getting some outpatient occupational therapy for her left hand but still has persistent fine motor skills difficulties particularly while playing piano.  She was seen by Dr.[place]olia cardiologist and had outpatient Biotronik loop recorder implanted on 07/24/2022 and so far paroxysmal A-fib has not yet been found.  She denies any prior history of  strokes, TIA, palpitations, syncope or cardiac arrhythmias. 14 system review of systems is positive for slurred speech, facial droop, and weakness, bruising and all other systems negative  PMH:  Past Medical History:  Diagnosis Date   Closed fracture of right distal radius    HLD (hyperlipidemia)    Stroke (HCC)     Social History:  Social History   Socioeconomic History   Marital status: Married    Spouse name: Chrissie Noa   Number of children: 5   Years of education: Not on file   Highest education level: Not on file  Occupational History   Not on file  Tobacco Use   Smoking status: Never   Smokeless tobacco: Never  Substance and Sexual Activity   Alcohol use: Not Currently   Drug use: Never   Sexual activity: Not on file  Other Topics Concern   Not on file  Social History Narrative   Not on file   Social Determinants of Health   Financial Resource Strain: Not on file  Food Insecurity: Not on file  Transportation Needs: Not on file  Physical Activity: Not on file  Stress: Not on file  Social Connections: Not on file  Intimate Partner Violence: Not on file    Medications:   Current Outpatient Medications on File Prior to Visit  Medication Sig Dispense Refill   b complex vitamins capsule Take 1 capsule by mouth daily.     calcium carbonate (OS-CAL - DOSED IN MG OF ELEMENTAL CALCIUM) 1250 (500 Ca) MG tablet Take 1 tablet by  mouth.     clopidogrel (PLAVIX) 75 MG tablet Take 1 tablet (75 mg total) by mouth daily. 30 tablet 3   rosuvastatin (CRESTOR) 20 MG tablet Take 1 tablet (20 mg total) by mouth daily. 30 tablet 2   No current facility-administered medications on file prior to visit.    Allergies:   Allergies  Allergen Reactions   Cephalosporins Swelling and Rash    Physical Exam General: well developed, well nourished pleasant Caucasian lady, seated, in no evident distress Head: head normocephalic and atraumatic.   Neck: supple with no carotid or  supraclavicular bruits Cardiovascular: regular rate and rhythm, no murmurs Musculoskeletal: no deformity Skin:  no rash/petichiae Vascular:  Normal pulses all extremities  Neurologic Exam Mental Status: Awake and fully alert. Oriented to place and time. Recent and remote memory intact. Attention span, concentration and fund of knowledge appropriate. Mood and affect appropriate.  Cranial Nerves: Fundoscopic exam reveals sharp disc margins. Pupils equal, briskly reactive to light. Extraocular movements full without nystagmus. Visual fields full to confrontation. Hearing intact. Facial sensation intact. Face, tongue, palate moves normally and symmetrically.  Motor: Normal bulk and tone. Normal strength in all tested extremity muscles. Sensory.: intact to touch , pinprick , position and vibratory sensation.  Coordination: Rapid alternating movements normal in all extremities. Finger-to-nose and heel-to-shin performed accurately bilaterally. Gait and Station: Arises from chair without difficulty. Stance is normal. Gait demonstrates normal stride length and balance . Able to heel, toe and tandem walk with mild difficulty.  Reflexes: 1+ and symmetric. Toes downgoing.   NIHSS  0 Modified Rankin  0   ASSESSMENT: 86 year old Caucasian lady with embolic right MCA infarcts of cryptogenic origin in September 2023.  Vascular risk factors of hypertension and hyperlipidemia.     PLAN: I had a long d/w patient  and her husband about her recent cryptogenic stroke, risk for recurrent stroke/TIAs, personally independently reviewed imaging studies and stroke evaluation results and answered questions.Continue  Plavix 75 mg daily  for secondary stroke prevention and maintain strict control of hypertension with blood pressure goal below 130/90, diabetes with hemoglobin A1c goal below 6.5% and lipids with LDL cholesterol goal below 70 mg/dL. I also advised the patient to eat a healthy diet with plenty of whole  grains, cereals, fruits and vegetables, exercise regularly and maintain ideal body weight.  Check follow-up lipid profile today.  Continue loop recorder monitoring paroxysmal A-fib by Dr. Odis Hollingshead cardiologist.  Followup in the future with me in 3 months or call earlier if needed.  Greater than 50% time during this 45-minute consultation visit was spent on counseling and coordination of care about a cryptogenic stroke and discussion about evaluation and treatment and answering questions.  Delia Heady, MD Note: This document was prepared with digital dictation and possible smart phrase technology. Any transcriptional errors that result from this process are unintentional.

## 2022-08-25 LAB — LIPID PANEL
Chol/HDL Ratio: 1.9 ratio (ref 0.0–4.4)
Cholesterol, Total: 138 mg/dL (ref 100–199)
HDL: 72 mg/dL (ref >39)
LDL Chol Calc (NIH): 53 mg/dL (ref 0–99)
Triglycerides: 66 mg/dL (ref 0–149)
VLDL Cholesterol Cal: 13 mg/dL (ref 5–40)

## 2022-08-25 NOTE — Progress Notes (Signed)
Kindly inform the patient that cholesterol profile is quite satisfactory.  No changes necessary

## 2022-08-27 ENCOUNTER — Telehealth: Payer: Self-pay

## 2022-08-27 ENCOUNTER — Encounter: Payer: Medicare Other | Admitting: Occupational Therapy

## 2022-08-27 NOTE — Telephone Encounter (Signed)
-----   Message from Micki Riley, MD sent at 08/25/2022  8:49 AM EST ----- Kindly inform the patient that cholesterol profile is quite satisfactory.  No changes necessary

## 2022-08-27 NOTE — Telephone Encounter (Signed)
I spoke with the patient's son, Dr. Clare Gandy (as per [facility]). I informed him of the patient's results. He verbalized understanding/appreciation for the call.

## 2022-09-03 ENCOUNTER — Encounter: Payer: Medicare Other | Admitting: Occupational Therapy

## 2022-09-10 ENCOUNTER — Encounter: Payer: Medicare Other | Admitting: Occupational Therapy

## 2022-09-17 ENCOUNTER — Encounter: Payer: Medicare Other | Admitting: Occupational Therapy

## 2022-09-24 ENCOUNTER — Encounter: Payer: Medicare Other | Admitting: Occupational Therapy

## 2022-10-01 ENCOUNTER — Encounter: Payer: Medicare Other | Admitting: Occupational Therapy

## 2022-10-08 ENCOUNTER — Encounter: Payer: Medicare Other | Admitting: Occupational Therapy

## 2022-12-28 ENCOUNTER — Encounter: Payer: Self-pay | Admitting: Neurology

## 2022-12-28 ENCOUNTER — Ambulatory Visit (INDEPENDENT_AMBULATORY_CARE_PROVIDER_SITE_OTHER): Payer: Medicare Other | Admitting: Neurology

## 2022-12-28 VITALS — BP 140/71 | HR 60 | Ht 68.0 in | Wt 128.0 lb

## 2022-12-28 DIAGNOSIS — R42 Dizziness and giddiness: Secondary | ICD-10-CM | POA: Diagnosis not present

## 2022-12-28 DIAGNOSIS — E782 Mixed hyperlipidemia: Secondary | ICD-10-CM | POA: Diagnosis not present

## 2022-12-28 DIAGNOSIS — I639 Cerebral infarction, unspecified: Secondary | ICD-10-CM

## 2022-12-28 NOTE — Progress Notes (Signed)
[facility] [address] [place]. [place] [address] [phone]       OFFICE FOLLOW-UP VISIT NOTE  Ms. QUINNETTA MACEACHERN Date of Birth:  05-Feb-1936 Medical Record Number:  [id number]   Referring MD:  Bonnielee Haff  Reason for Referral:  Stroke  HPI: Initial visit 08/24/2022 Ms. Gaza is a 87 year old pleasant Caucasian lady seen today for initial office consultation visit for stroke.  History is obtained from the patient and her husband as well as review of electronic medical records.  I have personally reviewed pertinent available imaging films in PACS.  She has past medical history of hyperlipidemia only.  She presented on 06/22/2022 with sudden onset of right upper extremity weakness as well as some slurred speech and difficulty holding a cup in the right hand and subsequently left hand weakness as well.  She also reported 3 weeks prior she had an episode of transient left leg weakness but did not seek medical help.  MRI scan of the brain showed small scattered right MCA embolic looking infarcts along with an old lacunar infarct in the right cerebellum.  MR angiogram of the brain shows multifocal intracranial morning right M2, left M2 and right A2 anterior cerebral artery segments.  MR angiogram of the neck shows no significant extracranial large vessel lesion.  2D echo showed ejection fraction of 60 to 65% cardiac source embolism.  Bubble study was negative.  LDL cholesterol was elevated at 109 mg% and hemoglobin A1c was 5.5.  Patient was started on aspirin and Plavix for 3 weeks followed by Plavix alone and she is tolerating it well without bruising or bleeding.  She is getting some outpatient occupational therapy for her left hand but still has persistent fine motor skills difficulties particularly while playing piano.  She was seen by [facility] cardiologist and had outpatient Biotronik loop recorder implanted on 07/24/2022 and so far paroxysmal A-fib has not yet been found.   She denies any prior history of strokes, TIA, palpitations, syncope or cardiac arrhythmias. Update 12/28/2022 : Patient returns for follow-up after last visit 4 months ago.  She states that she has not been doing well with feeling of dizziness and sick all over.  She initially felt this was related to cholesterol and continue with about a month ago.  She is felt somewhat better but not all the way back to normal.  Her primary care physician has recently cut the dose of Norvasc 2.5 mg daily.  Her blood pressure is elevated today at 160/75.  Patient and her husband are not entirely convinced that the Crestor was causing her to not feel well.  Her lipid profile however was quite good on 08/24/2022 with LDL cholesterol being 63 mg percent.  She has not had any recurrent TIA or stroke symptoms.  She remains on Plavix which she is tolerating well without bleeding and only minor bruising..  She has a loop recorder inserted by Dr. Terri Skains but so far paroxysmal A-fib has not yet been found.  14 system review of systems is positive for dizziness, weakness , bruising and all other systems negative  PMH:  Past Medical History:  Diagnosis Date   Closed fracture of right distal radius    HLD (hyperlipidemia)    Stroke     Social History:  Social History   Socioeconomic History   Marital status: Married    Spouse name: Gwyndolyn Saxon   Number of children: 5   Years of education: Not on file   Highest education level:  Not on file  Occupational History   Not on file  Tobacco Use   Smoking status: Never   Smokeless tobacco: Never  Substance and Sexual Activity   Alcohol use: Not Currently   Drug use: Never   Sexual activity: Not on file  Other Topics Concern   Not on file  Social History Narrative   Not on file   Social Determinants of Health   Financial Resource Strain: Not on file  Food Insecurity: Not on file  Transportation Needs: Not on file  Physical Activity: Not on file  Stress: Not on file   Social Connections: Not on file  Intimate Partner Violence: Not on file    Medications:   Current Outpatient Medications on File Prior to Visit  Medication Sig Dispense Refill   b complex vitamins capsule Take 1 capsule by mouth daily.     calcium carbonate (OS-CAL - DOSED IN MG OF ELEMENTAL CALCIUM) 1250 (500 Ca) MG tablet Take 1 tablet by mouth.     clopidogrel (PLAVIX) 75 MG tablet Take 1 tablet (75 mg total) by mouth daily. 30 tablet 3   amLODipine (NORVASC) 5 MG tablet Take 1 tablet (5 mg total) by mouth every morning. 30 tablet 0   rosuvastatin (CRESTOR) 20 MG tablet Take 1 tablet (20 mg total) by mouth daily. (Patient not taking: Reported on 12/28/2022) 30 tablet 2   No current facility-administered medications on file prior to visit.    Allergies:   Allergies  Allergen Reactions   Cephalosporins Swelling and Rash    Physical Exam General: well developed, well nourished pleasant Caucasian lady, seated, in no evident distress Head: head normocephalic and atraumatic.   Neck: supple with no carotid or supraclavicular bruits Cardiovascular: regular rate and rhythm, no murmurs Musculoskeletal: no deformity Skin:  no rash/petichiae Vascular:  Normal pulses all extremities  Neurologic Exam Mental Status: Awake and fully alert. Oriented to place and time. Recent and remote memory intact. Attention span, concentration and fund of knowledge appropriate. Mood and affect appropriate.  Cranial Nerves: Fundoscopic exam not done. Pupils equal, briskly reactive to light. Extraocular movements full without nystagmus. Visual fields full to confrontation. Hearing intact. Facial sensation intact. Face, tongue, palate moves normally and symmetrically.  Motor: Normal bulk and tone. Normal strength in all tested extremity muscles. Sensory.: intact to touch , pinprick , position and vibratory sensation.  Coordination: Rapid alternating movements normal in all extremities. Finger-to-nose and  heel-to-shin performed accurately bilaterally. Gait and Station: Arises from chair without difficulty. Stance is normal. Gait demonstrates normal stride length and balance . Able to heel, toe and tandem walk with mild difficulty.  Reflexes: 1+ and symmetric. Toes downgoing.     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  Vascular risk factors of hypertension and hyperlipidemia.     PLAN:  I had a long d/w patient  and her husband about her recent cryptogenic stroke, risk for recurrent stroke/TIAs, personally independently reviewed imaging studies and stroke evaluation results and answered questions.Continue  Plavix 75 mg daily  for secondary stroke prevention and maintain strict control of hypertension with blood pressure goal below 130/90, diabetes with hemoglobin A1c goal below 6.5% and lipids with LDL cholesterol goal below 70 mg/dL. I also advised the patient to eat a healthy diet with plenty of whole grains, cereals, fruits and vegetables, exercise regularly and maintain ideal body weight.  Continue loop recorder monitoring paroxysmal A-fib by Dr. Terri Skains cardiologist.  I  encouraged her to resume her Crestor since her symptoms seem unlikely to be related to it seems to have not improved completely after stopping it for more than a month.  Advised her to follow-up with primary care physician for further evaluation for her symptoms of weakness and dizziness.  Followup in the future with me in 1 year or call earlier if needed.   Greater than 50% time during this 35-minute  visit was spent on counseling and coordination of care about a cryptogenic stroke and discussion about evaluation and treatment and answering questions.  Antony Contras, MD Note: This document was prepared with digital dictation and possible smart phrase technology. Any transcriptional errors that result from this process are unintentional.

## 2022-12-28 NOTE — Patient Instructions (Addendum)
I had a long d/w patient  and her husband about her recent cryptogenic stroke, risk for recurrent stroke/TIAs, personally independently reviewed imaging studies and stroke evaluation results and answered questions.Continue  Plavix 75 mg daily  for secondary stroke prevention and maintain strict control of hypertension with blood pressure goal below 130/90, diabetes with hemoglobin A1c goal below 6.5% and lipids with LDL cholesterol goal below 70 mg/dL. I also advised the patient to eat a healthy diet with plenty of whole grains, cereals, fruits and vegetables, exercise regularly and maintain ideal body weight.  Continue loop recorder monitoring paroxysmal A-fib by Dr. Terri Skains cardiologist.  Followup in the future with me in 1 year or call earlier if needed.   Stroke Prevention Some medical conditions and behaviors can lead to a higher chance of having a stroke. You can help prevent a stroke by eating healthy, exercising, not smoking, and managing any medical conditions you have. Stroke is a leading cause of functional impairment. Primary prevention is particularly important because a majority of strokes are first-time events. Stroke changes the lives of not only those who experience a stroke but also their family and other caregivers. How can this condition affect me? A stroke is a medical emergency and should be treated right away. A stroke can lead to brain damage and can sometimes be life-threatening. If a person gets medical treatment right away, there is a better chance of surviving and recovering from a stroke. What can increase my risk? The following medical conditions may increase your risk of a stroke: Cardiovascular disease. High blood pressure (hypertension). Diabetes. High cholesterol. Sickle cell disease. Blood clotting disorders (hypercoagulable state). Obesity. Sleep disorders (obstructive sleep apnea). Other risk factors include: Being older than age 43. Having a history of blood  clots, stroke, or mini-stroke (transient ischemic attack, TIA). Genetic factors, such as race, ethnicity, or a family history of stroke. Smoking cigarettes or using other tobacco products. Taking birth control pills, especially if you also use tobacco. Heavy use of alcohol or drugs, especially cocaine and methamphetamine. Physical inactivity. What actions can I take to prevent this? Manage your health conditions High cholesterol levels. Eating a healthy diet is important for preventing high cholesterol. If cholesterol cannot be managed through diet alone, you may need to take medicines. Take any prescribed medicines to control your cholesterol as told by your health care provider. Hypertension. To reduce your risk of stroke, try to keep your blood pressure below 130/80. Eating a healthy diet and exercising regularly are important for controlling blood pressure. If these steps are not enough to manage your blood pressure, you may need to take medicines. Take any prescribed medicines to control hypertension as told by your health care provider. Ask your health care provider if you should monitor your blood pressure at home. Have your blood pressure checked every year, even if your blood pressure is normal. Blood pressure increases with age and some medical conditions. Diabetes. Eating a healthy diet and exercising regularly are important parts of managing your blood sugar (glucose). If your blood sugar cannot be managed through diet and exercise, you may need to take medicines. Take any prescribed medicines to control your diabetes as told by your health care provider. Get evaluated for obstructive sleep apnea. Talk to your health care provider about getting a sleep evaluation if you snore a lot or have excessive sleepiness. Make sure that any other medical conditions you have, such as atrial fibrillation or atherosclerosis, are managed. Nutrition Follow instructions from your health  care  provider about what to eat or drink to help manage your health condition. These instructions may include: Reducing your daily calorie intake. Limiting how much salt (sodium) you use to 1,500 milligrams (mg) each day. Using only healthy fats for cooking, such as olive oil, canola oil, or sunflower oil. Eating healthy foods. You can do this by: Choosing foods that are high in fiber, such as whole grains, and fresh fruits and vegetables. Eating at least 5 servings of fruits and vegetables a day. Try to fill one-half of your plate with fruits and vegetables at each meal. Choosing lean protein foods, such as lean cuts of meat, poultry without skin, fish, tofu, beans, and nuts. Eating low-fat dairy products. Avoiding foods that are high in sodium. This can help lower blood pressure. Avoiding foods that have saturated fat, trans fat, and cholesterol. This can help prevent high cholesterol. Avoiding processed and prepared foods. Counting your daily carbohydrate intake.  Lifestyle If you drink alcohol: Limit how much you have to: 0-1 drink a day for women who are not pregnant. 0-2 drinks a day for men. Know how much alcohol is in your drink. In the [place] one drink equals one 12 oz bottle of beer (360mL), one 5 oz glass of wine (121mL), or one 1 oz glass of hard liquor (71mL). Do not use any products that contain nicotine or tobacco. These products include cigarettes, chewing tobacco, and vaping devices, such as e-cigarettes. If you need help quitting, ask your health care provider. Avoid secondhand smoke. Do not use drugs. Activity  Try to stay at a healthy weight. Get at least 30 minutes of exercise on most days, such as: Fast walking. Biking. Swimming. Medicines Take over-the-counter and prescription medicines only as told by your health care provider. Aspirin or blood thinners (antiplatelets or anticoagulants) may be recommended to reduce your risk of forming blood clots that can lead to  stroke. Avoid taking birth control pills. Talk to your health care provider about the risks of taking birth control pills if: You are over 70 years old. You smoke. You get very bad headaches. You have had a blood clot. Where to find more information American Stroke Association: www.strokeassociation.org Get help right away if: You or a loved one has any symptoms of a stroke. "BE FAST" is an easy way to remember the main warning signs of a stroke: B - Balance. Signs are dizziness, sudden trouble walking, or loss of balance. E - Eyes. Signs are trouble seeing or a sudden change in vision. F - Face. Signs are sudden weakness or numbness of the face, or the face or eyelid drooping on one side. A - Arms. Signs are weakness or numbness in an arm. This happens suddenly and usually on one side of the body. S - Speech. Signs are sudden trouble speaking, slurred speech, or trouble understanding what people say. T - Time. Time to call emergency services. Write down what time symptoms started. You or a loved one has other signs of a stroke, such as: A sudden, severe headache with no known cause. Nausea or vomiting. Seizure. These symptoms may represent a serious problem that is an emergency. Do not wait to see if the symptoms will go away. Get medical help right away. Call your local emergency services (911 in the [place] Do not drive yourself to the hospital. Summary You can help to prevent a stroke by eating healthy, exercising, not smoking, limiting alcohol intake, and managing any medical conditions you may have.  Do not use any products that contain nicotine or tobacco. These include cigarettes, chewing tobacco, and vaping devices, such as e-cigarettes. If you need help quitting, ask your health care provider. Remember "BE FAST" for warning signs of a stroke. Get help right away if you or a loved one has any of these signs. This information is not intended to replace advice given to you by your  health care provider. Make sure you discuss any questions you have with your health care provider. Document Revised: 03/28/2020 Document Reviewed: 04/15/2020 Elsevier Patient Education  [place].

## 2023-01-27 NOTE — Therapy (Signed)
OCCUPATIONAL THERAPY DISCHARGE SUMMARY  Patient was seen for two visits 07/2022 and did not have follow-up OT appointments. OT to discharge from caseload and resolve episode.

## 2023-01-29 ENCOUNTER — Ambulatory Visit: Payer: Medicare Other | Admitting: Cardiology

## 2023-02-11 ENCOUNTER — Encounter: Payer: Self-pay | Admitting: Cardiology

## 2023-02-11 ENCOUNTER — Ambulatory Visit: Payer: Medicare Other | Admitting: Cardiology

## 2023-02-11 VITALS — BP 150/82 | HR 80 | Resp 16 | Ht 68.0 in | Wt 127.2 lb

## 2023-02-11 DIAGNOSIS — Z95818 Presence of other cardiac implants and grafts: Secondary | ICD-10-CM

## 2023-02-11 DIAGNOSIS — E782 Mixed hyperlipidemia: Secondary | ICD-10-CM

## 2023-02-11 DIAGNOSIS — I10 Essential (primary) hypertension: Secondary | ICD-10-CM

## 2023-02-11 DIAGNOSIS — G459 Transient cerebral ischemic attack, unspecified: Secondary | ICD-10-CM

## 2023-02-11 DIAGNOSIS — I480 Paroxysmal atrial fibrillation: Secondary | ICD-10-CM

## 2023-02-11 DIAGNOSIS — Z8673 Personal history of transient ischemic attack (TIA), and cerebral infarction without residual deficits: Secondary | ICD-10-CM

## 2023-02-11 HISTORY — DX: Presence of other cardiac implants and grafts: Z95.818

## 2023-02-11 MED ORDER — APIXABAN 2.5 MG PO TABS: 2.5000 mg | ORAL_TABLET | Freq: Two times a day (BID) | ORAL | 2 refills | Status: DC

## 2023-02-11 MED ORDER — METOPROLOL SUCCINATE ER 25 MG PO TB24: 25.0000 mg | ORAL_TABLET | Freq: Every day | ORAL | 0 refills | Status: DC

## 2023-02-11 NOTE — Progress Notes (Signed)
ID:  Katrina Rios, DOB 03-26-1936, MRN [id number]  PCP:  Kaleen Mask, MD  Cardiologist:  Tessa Lerner, DO, [facility] (established care 07/03/2022)  Date: 02/11/23 Last Office Visit: 07/04/2023  Chief Complaint  Patient presents with   Cerebrovascular Accident   Follow-up    HPI  Katrina Rios is a 87 y.o. Caucasian female whose past medical history and cardiovascular risk factors include: HTN, Hyperlipidemia, Hx of stroke, .  In September 2023 she presented to the ED with focal neurological deficits and slurred speech.  She underwent stroke workup and MRI of the brain noted scattered small acute infarcts in the right MCA distribution without hemorrhage or mass effect.  During hospitalization she was evaluated by neurology and also underwent transthoracic echo with bubble study.  LVEF noted to be preserved, grade 1 diastolic impairment, and agitated sonicated study negative for PFO.  Neurology recommended loop implantation the patient was referred to cardiology for the same and she was referred to the practice.   She underwent loop implant in October 2023.  She presents today for follow-up.  Unfortunately, for reasons unknown her loop recorder implant had not been registered with [employer] and the data has not been transmitted.  This was investigated with further during the office visit.  [employer] interrogation device quick summary was obtained.  Patient had a brief report provided to patient has had episodes of atrial fibrillation.  EGM's are not available for review.  Longest A-fib episode was 3 hours and 51 minutes.  Clinically she denies anginal chest pain.  Dyspnea on exertion is still present and occurs randomly.  At times he does get lightheaded and dizzy with changing positions.  And home blood pressures are usually better controlled with SBP 130 mmHg on current medical therapy.  ALLERGIES: Allergies  Allergen Reactions   Cephalosporins Swelling and Rash     MEDICATION LIST PRIOR TO VISIT: Current Meds  Medication Sig   amLODipine (NORVASC) 5 MG tablet Take 1 tablet (5 mg total) by mouth every morning.   apixaban (ELIQUIS) 2.5 MG TABS tablet Take 1 tablet (2.5 mg total) by mouth 2 (two) times daily.   b complex vitamins capsule Take 1 capsule by mouth daily.   calcium carbonate (OS-CAL - DOSED IN MG OF ELEMENTAL CALCIUM) 1250 (500 Ca) MG tablet Take 1 tablet by mouth.   estradiol (ESTRACE) 0.5 MG tablet Take 0.5 mg by mouth daily as needed.   metoprolol succinate (TOPROL XL) 25 MG 24 hr tablet Take 1 tablet (25 mg total) by mouth daily.   rosuvastatin (CRESTOR) 20 MG tablet Take 1 tablet (20 mg total) by mouth daily.   [DISCONTINUED] clopidogrel (PLAVIX) 75 MG tablet Take 1 tablet (75 mg total) by mouth daily.     PAST MEDICAL HISTORY: Past Medical History:  Diagnosis Date   Closed fracture of right distal radius    HLD (hyperlipidemia)    Stroke (HCC)     PAST SURGICAL HISTORY: Past Surgical History:  Procedure Laterality Date   ABDOMINAL HYSTERECTOMY     COLONOSCOPY     OPEN REDUCTION INTERNAL FIXATION (ORIF) DISTAL RADIAL FRACTURE Right 01/27/2021   Procedure: OPEN REDUCTION INTERNAL FIXATION (ORIF) DISTAL RADIAL FRACTURE;  Surgeon: Ernest Mallick, MD;  Location: [facility];  Service: Orthopedics;  Laterality: Right;   TONSILLECTOMY      FAMILY HISTORY: The patient family history includes Alzheimer's disease in her sister; Stroke in her father.  SOCIAL HISTORY:  The patient  reports  that she has never smoked. She has never used smokeless tobacco. She reports that she does not currently use alcohol. She reports that she does not use drugs.  REVIEW OF SYSTEMS: Review of Systems  Cardiovascular:  Positive for dyspnea on exertion. Negative for chest pain, claudication, irregular heartbeat, leg swelling, near-syncope, orthopnea, palpitations, paroxysmal nocturnal dyspnea and syncope.  Respiratory:   Positive for shortness of breath.   Hematologic/Lymphatic: Negative for bleeding problem.  Musculoskeletal:  Negative for muscle cramps and myalgias.  Neurological:  Positive for dizziness (chronic and stable). Negative for focal weakness and light-headedness.   PHYSICAL EXAM:    02/11/2023    9:38 AM 12/28/2022    2:09 PM 12/28/2022    2:06 PM  Vitals with BMI  Height 5\' 8"   5\' 8"   Weight 127 lbs 3 oz  128 lbs  BMI 19.35  19.47  Systolic 150 140 161  Diastolic 82 71 75  Pulse 80 60 60    Physical Exam  Constitutional: No distress.  Age appropriate, hemodynamically stable.   Neck: No JVD present.  Cardiovascular: Normal rate, regular rhythm, S1 normal, S2 normal, intact distal pulses and normal pulses. Exam reveals no gallop, no S3 and no S4.  No murmur heard. Pulses:      Dorsalis pedis pulses are 2+ on the right side and 2+ on the left side.       Posterior tibial pulses are 2+ on the right side and 2+ on the left side.  Pulmonary/Chest: Effort normal and breath sounds normal. No stridor. She has no wheezes. She has no rales.  Abdominal: Soft. Bowel sounds are normal. She exhibits no distension. There is no abdominal tenderness.  Musculoskeletal:        General: No edema.     Cervical back: Neck supple.  Neurological: She is alert and oriented to person, place, and time. She has intact cranial nerves (2-12).  Strength 5 out of 5 lower extremities bilaterally and right upper extremity.  Strength 4 out of 5 in the left upper extremity.  Mild facial asymmetry.  Skin: Skin is warm and moist.     RADIOLOGY:  MRA neck w/ and w/o contrast:  06/23/2022: 1. Scattered small acute infarcts in the right MCA distribution without hemorrhage or mass effect. 2. Background chronic white matter microangiopathy and small remote lacunar infarct in the right cerebellar hemisphere. 3. Short-segment moderate stenosis with diminished flow related enhancement of a right M2 branch just after the  bifurcation. The distal right MCA branches appear patent. 4. Short-segment high-grade stenosis/occlusion of a left M2 branch within the sylvian fissure and right A2 segment with distal reconstitution. 5. Moderate stenosis of the left A2 and bilateral P2 segments, and focal high-grade stenosis/occlusion of the right P2/P3 segment. 6. Patent vasculature of the neck with no hemodynamically significant stenosis or occlusion.  CARDIAC DATABASE: EKG: Feb 11, 2023: Sinus rhythm, 64 bpm,No axis, without underlying ischemia or injury pattern  Echocardiogram: 06/23/2022: LVEF 60-65%, moderate asymmetric left ventricular basal septal hypertrophy, grade 1 diastolic impairment, trivial MR, agitated sonicated study negative for PFO (on a limited 2D echo).  Stress Testing: No results found for this or any previous visit from the past 1095 days.  Heart Catheterization: None  Lower extremity venous duplex: 06/24/2022: Negative for DVT bilaterally.  LABORATORY DATA:    Latest Ref Rng & Units 06/23/2022    4:57 AM 06/22/2022    8:21 PM 06/22/2022    5:52 PM  CBC  WBC 4.0 -  10.5 K/uL 6.3  7.1    Hemoglobin 12.0 - 15.0 g/dL 16.1  09.6  04.5   Hematocrit 36.0 - 46.0 % 35.7  37.8  38.0   Platelets 150 - 400 K/uL 210  231         Latest Ref Rng & Units 06/23/2022    4:57 AM 06/22/2022    8:21 PM 06/22/2022    5:52 PM  CMP  Glucose 70 - 99 mg/dL 81  409  97   BUN 8 - 23 mg/dL 19  25  24    Creatinine 0.44 - 1.00 mg/dL 8.11  9.14  7.82   Sodium 135 - 145 mmol/L 141  137  137   Potassium 3.5 - 5.1 mmol/L 4.0  4.8  4.8   Chloride 98 - 111 mmol/L 107  101  99   CO2 22 - 32 mmol/L 27  29    Calcium 8.9 - 10.3 mg/dL 9.0  9.2    Total Protein 6.5 - 8.1 g/dL 6.4  7.4    Total Bilirubin 0.3 - 1.2 mg/dL 0.7  0.5    Alkaline Phos 38 - 126 U/L 56  62    AST 15 - 41 U/L 22  23    ALT 0 - 44 U/L 13  14      Lipid Panel     Component Value Date/Time   CHOL 138 08/24/2022 1155   TRIG 66 08/24/2022 1155    HDL 72 08/24/2022 1155   CHOLHDL 1.9 08/24/2022 1155   CHOLHDL 2.9 06/23/2022 0457   VLDL 10 06/23/2022 0457   LDLCALC 53 08/24/2022 1155   LABVLDL 13 08/24/2022 1155    No components found for: "NTPROBNP" No results for input(s): "PROBNP" in the last 8760 hours. No results for input(s): "TSH" in the last 8760 hours.  BMP Recent Labs    06/22/22 1752 06/22/22 2021 06/23/22 0457  NA 137 137 141  K 4.8 4.8 4.0  CL 99 101 107  CO2  --  29 27  GLUCOSE 97 101* 81  BUN 24* 25* 19  CREATININE 1.20* 1.16* 0.99  CALCIUM  --  9.2 9.0  GFRNONAA  --  46* 56*    HEMOGLOBIN A1C Lab Results  Component Value Date   HGBA1C 5.5 06/22/2022   MPG 111.15 06/22/2022    IMPRESSION:    ICD-10-CM   1. Paroxysmal A-fib (HCC)  I48.0 EKG 12-Lead    apixaban (ELIQUIS) 2.5 MG TABS tablet    Hemoglobin and hematocrit, blood    Basic metabolic panel    metoprolol succinate (TOPROL XL) 25 MG 24 hr tablet    2. Benign hypertension  I10     3. TIA (transient ischemic attack)  G45.9     4. History of stroke  Z86.73     5. Status post placement of implantable loop recorder -[employer] device  Z95.818     6. Mixed hyperlipidemia  E78.2        RECOMMENDATIONS: JOSSLIN DALKE is a 87 y.o. Caucasian female whose past medical history and cardiac risk factors include: Hypertension, hyperlipidemia, TIA/status post CVA.  Paroxysmal A-fib (HCC) Rate control: Metoprolol. Rhythm control: N/A. Thromboembolic prophylaxis: Eliquis Click Here to Calculate/Change CHADS2VASc Score The patient's CHADS2-VASc score is 6, indicating a 9.7% annual risk of stroke.  Therefore, anticoagulation is recommended.   CHF History: No HTN History: Yes Diabetes History: No Stroke History: Yes Vascular Disease History: No Diagnosed: Implantable loop recorder Patient had a loop implant  in October 2023 status post stroke.  She presents today for follow-up.  Unfortunately, her device has not been transmitting data.   Reached out to [employer] and appears that the device was not registered at the time of implant.  We were able to obtain a summary report based on the interrogation device present in the office.  Based on the summary patient has had episodes within A-fib criteria since implant.  Longest A-fib episode documented was 3 hours and 51 minutes.   Though the EGM's are not available for review given history of stroke, multiple risk factors, and the longest episode of A-fib documented is 3 hours and 51 minutes we discussed pharmacological therapy and continued monitoring. After discussing the pathophysiology of atrial fibrillation shared decision was to start metoprolol and discontinue Plavix and start Eliquis for thromboembolic prophylaxis. I informed the patient and the husband that if her weight becomes greater than 60 pounds (132 pounds) the dose of Eliquis will need to be changed.  He should reach out to the office for further guidance.  Check baseline hemoglobin/hematocrit and BMP. On behalf of the practice and myself I apologized sincerely multiple times during this office visit to the patient and her husband.  Will reach out to the [employer] rep to see if there is a clarification on why the device was not registered to avoid this in the future.  Either myself or the office manager will reach out to the patient or her husband in the coming week.  Benign hypertension Office blood pressures are not well-controlled. Husband states that home blood pressures are better controlled. She has lightheaded and dizziness with changing positions quickly which I suspect is likely secondary to orthostasis. Will hold off on up titration antihypertensive medications for now.  TIA (transient ischemic attack) History of stroke Status post placement of implantable loop recorder -[employer] device Follows with neurology given her history of TIA/stroke. She had a loop recorder implanted in October 2023 to monitor for  paroxysmal atrial fibrillation.  See above Reemphasized importance of secondary prevention. Will discontinue Plavix as she is being started on anticoagulation to minimize risk of bleeding. Reviewed the risks, benefits, and alternatives to anticoagulation.  Mixed hyperlipidemia Currently on rosuvastatin.   She denies myalgia or other side effects.  FINAL MEDICATION LIST END OF ENCOUNTER: Meds ordered this encounter  Medications   apixaban (ELIQUIS) 2.5 MG TABS tablet    Sig: Take 1 tablet (2.5 mg total) by mouth 2 (two) times daily.    Dispense:  60 tablet    Refill:  2   metoprolol succinate (TOPROL XL) 25 MG 24 hr tablet    Sig: Take 1 tablet (25 mg total) by mouth daily.    Dispense:  30 tablet    Refill:  0    Medications Discontinued During This Encounter  Medication Reason   clopidogrel (PLAVIX) 75 MG tablet Discontinued by provider     Current Outpatient Medications:    amLODipine (NORVASC) 5 MG tablet, Take 1 tablet (5 mg total) by mouth every morning., Disp: 30 tablet, Rfl: 0   apixaban (ELIQUIS) 2.5 MG TABS tablet, Take 1 tablet (2.5 mg total) by mouth 2 (two) times daily., Disp: 60 tablet, Rfl: 2   b complex vitamins capsule, Take 1 capsule by mouth daily., Disp: , Rfl:    calcium carbonate (OS-CAL - DOSED IN MG OF ELEMENTAL CALCIUM) 1250 (500 Ca) MG tablet, Take 1 tablet by mouth., Disp: , Rfl:    estradiol (ESTRACE) 0.5 MG tablet,  Take 0.5 mg by mouth daily as needed., Disp: , Rfl:    metoprolol succinate (TOPROL XL) 25 MG 24 hr tablet, Take 1 tablet (25 mg total) by mouth daily., Disp: 30 tablet, Rfl: 0   rosuvastatin (CRESTOR) 20 MG tablet, Take 1 tablet (20 mg total) by mouth daily., Disp: 30 tablet, Rfl: 2  Orders Placed This Encounter  Procedures   Hemoglobin and hematocrit, blood   Basic metabolic panel   EKG 12-Lead    There are no Patient Instructions on file for this visit.   --Continue cardiac medications as reconciled in final medication  list. --Return in about 4 weeks (around 03/11/2023) for Follow up, A. fib. or sooner if needed. --Continue follow-up with your primary care physician regarding the management of your other chronic comorbid conditions.  Patient's questions and concerns were addressed to her satisfaction. She voices understanding of the instructions provided during this encounter.   This note was created using a voice recognition software as a result there may be grammatical errors inadvertently enclosed that do not reflect the nature of this encounter. Every attempt is made to correct such errors.  Tessa Lerner, [place], [facility]  Pager:  [phone] Office: [phone]

## 2023-02-12 ENCOUNTER — Ambulatory Visit: Payer: Self-pay | Admitting: Cardiology

## 2023-02-12 ENCOUNTER — Encounter: Payer: Self-pay | Admitting: Cardiology

## 2023-02-12 ENCOUNTER — Telehealth: Payer: Self-pay

## 2023-02-12 DIAGNOSIS — I491 Atrial premature depolarization: Secondary | ICD-10-CM

## 2023-02-12 DIAGNOSIS — G459 Transient cerebral ischemic attack, unspecified: Secondary | ICD-10-CM

## 2023-02-12 DIAGNOSIS — Z95818 Presence of other cardiac implants and grafts: Secondary | ICD-10-CM

## 2023-02-12 DIAGNOSIS — Z4509 Encounter for adjustment and management of other cardiac device: Secondary | ICD-10-CM

## 2023-02-12 NOTE — Progress Notes (Signed)
Chief Complaint  Patient presents with   loop recorder    Check for AF and reporgramming     ICD-10-CM   1. Encounter for loop recorder check  Z45.09     2. Loop recorder [employer] III 07/24/2022  Z95.818     3. TIA (transient ischemic attack)  G45.9     4. PAC (premature atrial contraction)  I49.1      Medications Discontinued During This Encounter  Medication Reason   apixaban (ELIQUIS) 2.5 MG TABS tablet Discontinued by provider   metoprolol succinate (TOPROL XL) 25 MG 24 hr tablet No longer needed (for PRN medications)    Atrial monitoring episodes and AF = False. Brief AT and mostly NSR with rate variation (S. Arrhythmia) and PAC.    Reprogrammed loop:  AF detection high RR variability limit increased from 12-18 and confirmation time increased from 2 minutes to 6 minutes. Changed sudden rate drop from 50 to 40 bpm and asystole from 3 to 5 seconds.  Personally discussed the findings with the patient and her husband at the bedside, advised them to discontinue metoprolol and also Eliquis as there is no indication.  Patient had an appointment to see [place] back in 1 month, we will change it back to 6 months as usual and they will contact us if there is any issue.

## 2023-02-12 NOTE — Telephone Encounter (Signed)
I spoke with Pavan with [employer], and after speaking with his supervisors, he said that all the information in the loop recorder is stored from the minute she has it implanted, but it would just take a 1 day or 2 to "dump" information in [employer]. I have checked [employer] this morning and all the information in in there now.

## 2023-03-11 ENCOUNTER — Ambulatory Visit: Payer: Medicare Other | Admitting: Cardiology

## 2023-03-15 ENCOUNTER — Other Ambulatory Visit: Payer: Self-pay | Admitting: Cardiology

## 2023-03-15 DIAGNOSIS — I480 Paroxysmal atrial fibrillation: Secondary | ICD-10-CM

## 2023-06-29 ENCOUNTER — Ambulatory Visit: Payer: Medicare Other

## 2023-06-29 DIAGNOSIS — G459 Transient cerebral ischemic attack, unspecified: Secondary | ICD-10-CM

## 2023-06-29 DIAGNOSIS — I480 Paroxysmal atrial fibrillation: Secondary | ICD-10-CM | POA: Diagnosis not present

## 2023-06-29 LAB — CUP PACEART REMOTE DEVICE CHECK
Date Time Interrogation Session: 20241001100721
Implantable Pulse Generator Implant Date: 20231027
Pulse Gen Model: 450218
Pulse Gen Serial Number: 95049378

## 2023-07-15 NOTE — Progress Notes (Signed)
[employer] Loop Recorder 

## 2023-07-30 ENCOUNTER — Ambulatory Visit (INDEPENDENT_AMBULATORY_CARE_PROVIDER_SITE_OTHER): Payer: Medicare Other

## 2023-07-30 DIAGNOSIS — I480 Paroxysmal atrial fibrillation: Secondary | ICD-10-CM | POA: Diagnosis not present

## 2023-07-30 LAB — CUP PACEART REMOTE DEVICE CHECK
Date Time Interrogation Session: 20241101073046
Implantable Pulse Generator Implant Date: 20231027
Pulse Gen Model: 450218
Pulse Gen Serial Number: 95049378

## 2023-08-11 NOTE — Progress Notes (Signed)
[facility] Summary Report / Loop Recorder 

## 2023-08-17 ENCOUNTER — Ambulatory Visit: Payer: Medicare Other | Attending: Cardiology | Admitting: Cardiology

## 2023-08-17 ENCOUNTER — Ambulatory Visit: Payer: Medicare Other | Admitting: Cardiology

## 2023-08-17 ENCOUNTER — Encounter: Payer: Self-pay | Admitting: Cardiology

## 2023-08-17 VITALS — BP 135/82 | HR 65 | Resp 16 | Ht 68.0 in | Wt 129.0 lb

## 2023-08-17 DIAGNOSIS — G459 Transient cerebral ischemic attack, unspecified: Secondary | ICD-10-CM | POA: Insufficient documentation

## 2023-08-17 DIAGNOSIS — I491 Atrial premature depolarization: Secondary | ICD-10-CM | POA: Insufficient documentation

## 2023-08-17 DIAGNOSIS — Z95818 Presence of other cardiac implants and grafts: Secondary | ICD-10-CM | POA: Diagnosis not present

## 2023-08-17 DIAGNOSIS — I10 Essential (primary) hypertension: Secondary | ICD-10-CM | POA: Diagnosis not present

## 2023-08-17 NOTE — Patient Instructions (Signed)

## 2023-08-17 NOTE — Progress Notes (Signed)
Cardiology Office Note:  .   Date:  08/17/2023  ID:  Katrina Rios, DOB Dec 24, 1935, MRN [id number] PCP:  Kaleen Mask, MD  Former Cardiology Providers: N/A [facility] Providers Cardiologist:  Tessa Lerner, DO , [facility] (established care 07/03/2022) Electrophysiologist:  None  Click to update primary MD,subspecialty MD or APP then REFRESH:1}    Chief Complaint  Patient presents with   Follow-up    History of CVA, status post loop recorder implant, monitoring for A-fib    History of Present Illness: .   Katrina Rios is a 87 y.o. Caucasian female whose past medical history and cardiovascular risk factors includes: HTN, Hyperlipidemia, Hx of stroke.  In September 2023 she presented to the ED with focal neurological deficits and slurred speech. She underwent stroke workup and MRI of the brain noted scattered small acute infarcts in the right MCA distribution without hemorrhage or mass effect.   She underwent loop implant in October 2023.   When patient presented to the office in May 2024 it was found that the loop recorder was not registered with [employer] and the data was not being transmitted.  Upon further interrogating the device in the clinic she was noted to have episodes of A-fib on the printed report but no associated EMGs.  After the device is registered we had additional data with regards to the EMGs and her underlying rhythm was sinus with PACs.  She had no documented episodes of atrial fibrillation and anticoagulation was discontinued at that time.  Her most recent loop recorder implant data from November 2024 notes no new A-fib episodes.  She presents today for 60-month follow-up visit.  Patient is accompanied by her husband at today's office visit.  Over the last 6 months no anginal chest pain or heart failure symptoms.  Home blood pressures are better controlled with SBP ranging between 120-130 mmHg.  She was initially on amlodipine 5 mg p.o. daily but was  having orthostasis and therefore the dosage was reduced to amlodipine 2.5 mg p.o. daily.  She continued to have similar symptoms and therefore the medication was discontinued.  Patient's husband states that she does have high blood pressures during office visits as compared to home readings.  Review of Systems: .   Review of Systems  Cardiovascular:  Negative for chest pain, claudication, irregular heartbeat, leg swelling, near-syncope, orthopnea, palpitations, paroxysmal nocturnal dyspnea and syncope.  Respiratory:  Negative for shortness of breath.   Hematologic/Lymphatic: Negative for bleeding problem.    Studies Reviewed:   EKG: EKG Interpretation Date/Time:  Tuesday August 17 2023 11:10:06 EST Text Interpretation: Sinus rhythm with 1st degree A-V block with Premature atrial complexes in a pattern of bigeminy When compared with ECG of 22-Jun-2022 17:40, No significant change since last tracing Confirmed by Tessa Lerner [id number]) on 08/17/2023 11:19:05 AM  Echocardiogram: 06/23/2022: LVEF 60-65%, moderate asymmetric left ventricular basal septal hypertrophy, grade 1 diastolic impairment, trivial MR, agitated sonicated study negative for PFO (on a limited 2D echo).  RADIOLOGY: MRA neck w/ and w/o contrast:  06/23/2022: 1. Scattered small acute infarcts in the right MCA distribution without hemorrhage or mass effect. 2. Background chronic white matter microangiopathy and small remote lacunar infarct in the right cerebellar hemisphere. 3. Short-segment moderate stenosis with diminished flow related enhancement of a right M2 branch just after the bifurcation. The distal right MCA branches appear patent. 4. Short-segment high-grade stenosis/occlusion of a left M2 branch within the sylvian fissure and right A2 segment with distal reconstitution.  5. Moderate stenosis of the left A2 and bilateral P2 segments, and focal high-grade stenosis/occlusion of the right P2/P3 segment. 6. Patent  vasculature of the neck with no hemodynamically significant stenosis or occlusion.  Risk Assessment/Calculations:   NA  Labs:       Latest Ref Rng & Units 06/23/2022    4:57 AM 06/22/2022    8:21 PM 06/22/2022    5:52 PM  CBC  WBC 4.0 - 10.5 K/uL 6.3  7.1    Hemoglobin 12.0 - 15.0 g/dL 44.0  10.2  72.5   Hematocrit 36.0 - 46.0 % 35.7  37.8  38.0   Platelets 150 - 400 K/uL 210  231         Latest Ref Rng & Units 06/23/2022    4:57 AM 06/22/2022    8:21 PM 06/22/2022    5:52 PM  BMP  Glucose 70 - 99 mg/dL 81  366  97   BUN 8 - 23 mg/dL 19  25  24    Creatinine 0.44 - 1.00 mg/dL 4.40  3.47  4.25   Sodium 135 - 145 mmol/L 141  137  137   Potassium 3.5 - 5.1 mmol/L 4.0  4.8  4.8   Chloride 98 - 111 mmol/L 107  101  99   CO2 22 - 32 mmol/L 27  29    Calcium 8.9 - 10.3 mg/dL 9.0  9.2        Latest Ref Rng & Units 06/23/2022    4:57 AM 06/22/2022    8:21 PM 06/22/2022    5:52 PM  CMP  Glucose 70 - 99 mg/dL 81  956  97   BUN 8 - 23 mg/dL 19  25  24    Creatinine 0.44 - 1.00 mg/dL 3.87  5.64  3.32   Sodium 135 - 145 mmol/L 141  137  137   Potassium 3.5 - 5.1 mmol/L 4.0  4.8  4.8   Chloride 98 - 111 mmol/L 107  101  99   CO2 22 - 32 mmol/L 27  29    Calcium 8.9 - 10.3 mg/dL 9.0  9.2    Total Protein 6.5 - 8.1 g/dL 6.4  7.4    Total Bilirubin 0.3 - 1.2 mg/dL 0.7  0.5    Alkaline Phos 38 - 126 U/L 56  62    AST 15 - 41 U/L 22  23    ALT 0 - 44 U/L 13  14      Lab Results  Component Value Date   CHOL 138 08/24/2022   HDL 72 08/24/2022   LDLCALC 53 08/24/2022   TRIG 66 08/24/2022   CHOLHDL 1.9 08/24/2022   No results for input(s): "LIPOA" in the last 8760 hours. No components found for: "NTPROBNP" No results for input(s): "PROBNP" in the last 8760 hours. No results for input(s): "TSH" in the last 8760 hours.  External Labs: Collected: Feb 10, 2023 provided by PCP's office BUN 20, creatinine 1.03. eGFR 54. Sodium 144, potassium 4.3, chloride 104, bicarb 26. AST 19, ALT  16, alkaline phosphatase 68 TSH 7.0 Hemoglobin 12.2 g/dL  Physical Exam:    Today's Vitals   08/17/23 1105 08/17/23 1129  BP: (!) 182/90 135/82  Pulse: 62 65  Resp: 16   SpO2: (!) 65%   Weight: 129 lb (58.5 kg)   Height: 5\' 8"  (1.727 m)    Body mass index is 19.61 kg/m. Wt Readings from Last 3 Encounters:  08/17/23 129 lb (58.5  kg)  02/11/23 127 lb 3.2 oz (57.7 kg)  12/28/22 128 lb (58.1 kg)    Physical Exam  Constitutional: No distress.  Age appropriate, hemodynamically stable.   Neck: No JVD present.  Cardiovascular: Normal rate, regular rhythm, S1 normal, S2 normal, intact distal pulses and normal pulses. Exam reveals no gallop, no S3 and no S4.  No murmur heard. Pulses:      Dorsalis pedis pulses are 2+ on the right side and 2+ on the left side.       Posterior tibial pulses are 2+ on the right side and 2+ on the left side.  Pulmonary/Chest: Effort normal and breath sounds normal. No stridor. She has no wheezes. She has no rales.  Abdominal: Soft. Bowel sounds are normal. She exhibits no distension. There is no abdominal tenderness.  Musculoskeletal:        General: No edema.     Cervical back: Neck supple.  Neurological: She is alert and oriented to person, place, and time.  Skin: Skin is warm and moist.     Impression & Recommendation(s):  Impression:   ICD-10-CM   1. PAC (premature atrial contraction)  I49.1 EKG 12-Lead    2. TIA (transient ischemic attack)  G45.9     3. Loop recorder Biomonitor III 07/24/2022  Z95.818     4. Benign hypertension  I10        Recommendation(s):  PAC (premature atrial contraction) Overall chronic and stable. Asymptomatic. Monitor for now. Has been sensitive to medical therapy in the past and therefore we will hold off on AV nodal blocking agents at this time  TIA (transient ischemic attack)/history of stroke Loop recorder Biomonitor III 07/24/2022 No reoccurrence of stroke or TIA-like symptoms since last office  encounter. Loop recorder transmissions from October and November 2024 independently reviewed-no documented A-fib. Reemphasized the importance of secondary prevention with focus on improving her modifiable cardiovascular risk factors such as glycemic control, lipid management, blood pressure control, weight loss.  Benign hypertension Office blood pressures initially were not well-controlled but upon recheck numbers are better. Home blood pressures are also well-controlled. Amlodipine has been down titrated due to orthostasis Patient's husband is a former Administrator, arts and is monitoring it at home.   Orders Placed:  Orders Placed This Encounter  Procedures   EKG 12-Lead    Final Medication List:   No orders of the defined types were placed in this encounter.   Medications Discontinued During This Encounter  Medication Reason   amLODipine (NORVASC) 5 MG tablet Patient Preference     Current Outpatient Medications:    b complex vitamins capsule, Take 1 capsule by mouth daily., Disp: , Rfl:    calcium carbonate (OS-CAL - DOSED IN MG OF ELEMENTAL CALCIUM) 1250 (500 Ca) MG tablet, Take 1 tablet by mouth., Disp: , Rfl:    clopidogrel (PLAVIX) 75 MG tablet, Take 75 mg by mouth daily., Disp: , Rfl:    estradiol (ESTRACE) 0.5 MG tablet, Take 0.5 mg by mouth daily as needed., Disp: , Rfl:    levothyroxine (SYNTHROID) 50 MCG tablet, Take 50 mcg by mouth every morning., Disp: , Rfl:    rosuvastatin (CRESTOR) 20 MG tablet, Take 1 tablet (20 mg total) by mouth daily., Disp: 30 tablet, Rfl: 2  Consent:   N/A  Disposition:   1 year Patient may be asked to follow-up sooner based on the results of the above-mentioned testing.  Her questions and concerns were addressed to her satisfaction. She voices understanding of the  recommendations provided during this encounter.    Signed, Tessa Lerner, DO, [facility]  [facility]  [address] [place], [place] [address] 08/17/2023 4:52 PM

## 2023-09-07 LAB — LAB REPORT - SCANNED: EGFR: 51

## 2023-12-27 ENCOUNTER — Ambulatory Visit (INDEPENDENT_AMBULATORY_CARE_PROVIDER_SITE_OTHER): Payer: Medicare Other

## 2023-12-27 DIAGNOSIS — I480 Paroxysmal atrial fibrillation: Secondary | ICD-10-CM

## 2023-12-29 ENCOUNTER — Encounter: Payer: Self-pay | Admitting: Neurology

## 2023-12-29 ENCOUNTER — Ambulatory Visit (INDEPENDENT_AMBULATORY_CARE_PROVIDER_SITE_OTHER): Payer: Medicare Other | Admitting: Neurology

## 2023-12-29 VITALS — BP 162/91 | HR 62 | Ht 67.0 in | Wt 134.0 lb

## 2023-12-29 DIAGNOSIS — I639 Cerebral infarction, unspecified: Secondary | ICD-10-CM

## 2023-12-29 DIAGNOSIS — Z8673 Personal history of transient ischemic attack (TIA), and cerebral infarction without residual deficits: Secondary | ICD-10-CM

## 2023-12-29 LAB — CUP PACEART REMOTE DEVICE CHECK
Date Time Interrogation Session: 20250402104602
Implantable Pulse Generator Implant Date: 20231027
Pulse Gen Model: 450218
Pulse Gen Serial Number: 95049378

## 2023-12-29 NOTE — Patient Instructions (Addendum)
 I had a long d/w patient  and her husband about her recent cryptogenic stroke, risk for recurrent stroke/TIAs, personally independently reviewed imaging studies and stroke evaluation results and answered questions.Continue  Plavix 75 mg daily  for secondary stroke prevention and maintain strict control of hypertension with blood pressure goal below 130/90, diabetes with hemoglobin A1c goal below 6.5% and lipids with LDL cholesterol goal below 70 mg/dL. I also advised the patient to eat a healthy diet with plenty of whole grains, cereals, fruits and vegetables, exercise regularly and maintain ideal body weight.  Check screening carotid ultrasound study.  Followup in the future with me in the future only as needed

## 2023-12-29 NOTE — Progress Notes (Signed)
 [facility] [address] [place]. [place] [address] [phone]       OFFICE FOLLOW-UP VISIT NOTE  Ms. Katrina Rios Date of Birth:  May 20, 1936 Medical Record Number:  [id number]   Referring MD:  Osvaldo Shipper  Reason for Referral:  Stroke  HPI: Initial visit 08/24/2022 Katrina Rios is a 88 year old pleasant Caucasian lady seen today for initial office consultation visit for stroke.  History is obtained from the patient and her husband as well as review of electronic medical records.  I have personally reviewed pertinent available imaging films in PACS.  She has past medical history of hyperlipidemia only.  She presented on 06/22/2022 with sudden onset of right upper extremity weakness as well as some slurred speech and difficulty holding a cup in the right hand and subsequently left hand weakness as well.  She also reported 3 weeks prior she had an episode of transient left leg weakness but did not seek medical help.  MRI scan of the brain showed small scattered right MCA embolic looking infarcts along with an old lacunar infarct in the right cerebellum.  MR angiogram of the brain shows multifocal intracranial morning right M2, left M2 and right A2 anterior cerebral artery segments.  MR angiogram of the neck shows no significant extracranial large vessel lesion.  2D echo showed ejection fraction of 60 to 65% cardiac source embolism.  Bubble study was negative.  LDL cholesterol was elevated at 109 mg% and hemoglobin A1c was 5.5.  Patient was started on aspirin and Plavix for 3 weeks followed by Plavix alone and she is tolerating it well without bruising or bleeding.  She is getting some outpatient occupational therapy for her left hand but still has persistent fine motor skills difficulties particularly while playing piano.  She was seen by Dr.Tolia cardiologist and had outpatient Bi[employer] loop recorder implanted on 07/24/2022 and so far paroxysmal A-fib has not yet been found.   She denies any prior history of strokes, TIA, palpitations, syncope or cardiac arrhythmias. Update 12/28/2022 : Patient returns for follow-up after last visit 4 months ago.  She states that she has not been doing well with feeling of dizziness and sick all over.  She initially felt this was related to cholesterol and continue with about a month ago.  She is felt somewhat better but not all the way back to normal.  Her primary care physician has recently cut the dose of Norvasc 2.5 mg daily.  Her blood pressure is elevated today at 160/75.  Patient and her husband are not entirely convinced that the Crestor was causing her to not feel well.  Her lipid profile however was quite good on 08/24/2022 with LDL cholesterol being 63 mg percent.  She has not had any recurrent TIA or stroke symptoms.  She remains on Plavix which she is tolerating well without bleeding and only minor bruising..  She has a loop recorder inserted by Dr. Odis Hollingshead but so far paroxysmal A-fib has not yet been found. Update 12/29/2023 : She returns for follow-up after last visit a year ago.  She is accompanied by her husband.  She states she is doing well.  She has had no recurrent stroke or TIA symptoms.  She has not had any dizzy spells either.  She remains on Plavix which is tolerating well without bruising or bleeding.  She is tolerating Crestor well as well.  Last lipid profile on 09/06/2023 showed LDL-cholesterol to be 63 mg percent.  Loop recorder has so far not  yet shown paroxysmal A-fib.  She has no new complaints today. 14 system review of systems is positive for dizziness, weakness , bruising and all other systems negative  PMH:  Past Medical History:  Diagnosis Date   Closed fracture of right distal radius    HLD (hyperlipidemia)    Loop recorder Biomonitor III 07/24/2022 02/11/2023   Stroke (HCC)     Social History:  Social History   Socioeconomic History   Marital status: Married    Spouse name: Chrissie Noa   Number of children:  5   Years of education: Not on file   Highest education level: Not on file  Occupational History   Not on file  Tobacco Use   Smoking status: Never   Smokeless tobacco: Never  Substance and Sexual Activity   Alcohol use: Not Currently   Drug use: Never   Sexual activity: Not on file  Other Topics Concern   Not on file  Social History Narrative   Not on file   Social Drivers of Health   Financial Resource Strain: Not on file  Food Insecurity: Not on file  Transportation Needs: Not on file  Physical Activity: Not on file  Stress: Not on file  Social Connections: Unknown (02/08/2022)   Received from [facility], [facility]   Social Network    Social Network: Not on file  Intimate Partner Violence: Unknown (12/31/2021)   Received from [facility], [facility]   HITS    Physically Hurt: Not on file    Insult or Talk Down To: Not on file    Threaten Physical Harm: Not on file    Scream or Curse: Not on file    Medications:   Current Outpatient Medications on File Prior to Visit  Medication Sig Dispense Refill   b complex vitamins capsule Take 1 capsule by mouth daily.     calcium carbonate (OS-CAL - DOSED IN MG OF ELEMENTAL CALCIUM) 1250 (500 Ca) MG tablet Take 1 tablet by mouth.     clopidogrel (PLAVIX) 75 MG tablet Take 75 mg by mouth daily.     estradiol (ESTRACE) 0.5 MG tablet Take 0.5 mg by mouth daily as needed.     levothyroxine (SYNTHROID) 50 MCG tablet Take 50 mcg by mouth every morning.     rosuvastatin (CRESTOR) 20 MG tablet Take 1 tablet (20 mg total) by mouth daily. 30 tablet 2   No current facility-administered medications on file prior to visit.    Allergies:   Allergies  Allergen Reactions   Cephalosporins Swelling and Rash    Physical Exam General: well developed, well nourished pleasant Caucasian lady, seated, in no evident distress Head: head normocephalic and atraumatic.   Neck: supple with no carotid or supraclavicular  bruits Cardiovascular: regular rate and rhythm, no murmurs Musculoskeletal: no deformity Skin:  no rash/petichiae Vascular:  Normal pulses all extremities  Neurologic Exam Mental Status: Awake and fully alert. Oriented to place and time. Recent and remote memory intact. Attention span, concentration and fund of knowledge appropriate. Mood and affect appropriate.  Cranial Nerves: Fundoscopic exam not done. Pupils equal, briskly reactive to light. Extraocular movements full without nystagmus. Visual fields full to confrontation. Hearing intact. Facial sensation intact. Face, tongue, palate moves normally and symmetrically.  Motor: Normal bulk and tone. Normal strength in all tested extremity muscles. Sensory.: intact to touch , pinprick , position and vibratory sensation.  Coordination: Rapid alternating movements normal in all extremities. Finger-to-nose and heel-to-shin performed accurately bilaterally. Gait and  Station: Arises from chair without difficulty. Stance is normal. Gait demonstrates normal stride length and balance . Able to heel, toe and tandem walk with moderate difficulty.  Reflexes: 1+ and symmetric. Toes downgoing.     ASSESSMENT: 88 year old Caucasian lady with embolic right MCA infarcts of cryptogenic origin in September 2023.  Vascular risk factors of hypertension and hyperlipidemia. She continues to do well without recurrent neurovascular symptoms.    PLAN:  I had a long d/w patient  and her husband about her recent cryptogenic stroke, risk for recurrent stroke/TIAs, personally independently reviewed imaging studies and stroke evaluation results and answered questions.Continue  Plavix 75 mg daily  for secondary stroke prevention and maintain strict control of hypertension with blood pressure goal below 130/90, diabetes with hemoglobin A1c goal below 6.5% and lipids with LDL cholesterol goal below 70 mg/dL. I also advised the patient to eat a healthy diet with plenty of whole  grains, cereals, fruits and vegetables, exercise regularly and maintain ideal body weight.  Check screening carotid ultrasound study.  Followup in the future with me in the future only as needed    Greater than 50% time during this 35-minute  visit was spent on counseling and coordination of care about a cryptogenic stroke and discussion about evaluation and treatment and answering questions.  Delia Heady, MD Note: This document was prepared with digital dictation and possible smart phrase technology. Any transcriptional errors that result from this process are unintentional.

## 2024-01-21 ENCOUNTER — Ambulatory Visit (HOSPITAL_COMMUNITY)
Admission: RE | Admit: 2024-01-21 | Discharge: 2024-01-21 | Disposition: A | Discharge status: Home / Self Care | Source: Ambulatory Visit | Attending: Neurology | Admitting: Neurology

## 2024-01-21 DIAGNOSIS — I639 Cerebral infarction, unspecified: Secondary | ICD-10-CM | POA: Diagnosis not present

## 2024-01-21 DIAGNOSIS — Z8673 Personal history of transient ischemic attack (TIA), and cerebral infarction without residual deficits: Secondary | ICD-10-CM | POA: Insufficient documentation

## 2024-01-21 NOTE — Progress Notes (Signed)
 Carotid arterial duplex completed. Please see CV Procedures for preliminary results.  Estanislao Heimlich, RVT 01/21/24 2:29 PM

## 2024-01-25 NOTE — Progress Notes (Signed)
Kindly inform the patient that carotid ultrasound study shows no significant blockages of either carotid artery in the neck

## 2024-01-26 ENCOUNTER — Telehealth: Payer: Self-pay

## 2024-01-26 NOTE — Telephone Encounter (Signed)
Contacted pt, LVM rq call back  

## 2024-01-26 NOTE — Telephone Encounter (Signed)
-----   Message from Ardella Beaver sent at 01/25/2024  4:57 PM EDT ----- Katrina Rios inform the patient that carotid ultrasound study shows no significant blockages of either carotid artery in the neck.

## 2024-01-27 NOTE — Telephone Encounter (Signed)
 Pt returned call to CMA

## 2024-01-27 NOTE — Telephone Encounter (Signed)
 Spouse retruned call, informed him carotid ultrasound study shows no significant blockages of either carotid artery in the neck. Number provided to call back with questions as he had none at this time and verbally understood.

## 2024-01-28 ENCOUNTER — Ambulatory Visit (INDEPENDENT_AMBULATORY_CARE_PROVIDER_SITE_OTHER): Payer: Medicare Other

## 2024-01-28 DIAGNOSIS — I480 Paroxysmal atrial fibrillation: Secondary | ICD-10-CM

## 2024-01-28 LAB — CUP PACEART REMOTE DEVICE CHECK
Date Time Interrogation Session: 20250502082853
Implantable Pulse Generator Implant Date: 20231027
Pulse Gen Model: 450218
Pulse Gen Serial Number: 95049378

## 2024-02-10 NOTE — Progress Notes (Signed)
 [employer] Loop [employer]

## 2024-02-10 NOTE — Addendum Note (Signed)
 Addended by: Edra Govern D on: 02/10/2024 12:30 PM   Modules accepted: Orders

## 2024-02-27 ENCOUNTER — Ambulatory Visit (INDEPENDENT_AMBULATORY_CARE_PROVIDER_SITE_OTHER)

## 2024-02-27 DIAGNOSIS — I480 Paroxysmal atrial fibrillation: Secondary | ICD-10-CM

## 2024-02-29 LAB — CUP PACEART REMOTE DEVICE CHECK
Date Time Interrogation Session: 20250603082018
Implantable Pulse Generator Implant Date: 20231027
Pulse Gen Model: 450218
Pulse Gen Serial Number: 95049378

## 2024-03-06 ENCOUNTER — Ambulatory Visit: Payer: Self-pay | Admitting: Cardiology

## 2024-03-08 NOTE — Progress Notes (Signed)
 [facility] Summary Report / Loop Recorder

## 2024-03-29 ENCOUNTER — Encounter

## 2024-03-31 ENCOUNTER — Ambulatory Visit

## 2024-03-31 DIAGNOSIS — G459 Transient cerebral ischemic attack, unspecified: Secondary | ICD-10-CM

## 2024-04-03 ENCOUNTER — Ambulatory Visit: Payer: Self-pay | Admitting: Cardiology

## 2024-04-03 LAB — CUP PACEART REMOTE DEVICE CHECK
Date Time Interrogation Session: 20250704115526
Implantable Pulse Generator Implant Date: 20231027
Pulse Gen Model: 450218
Pulse Gen Serial Number: 95049378

## 2024-04-17 NOTE — Addendum Note (Signed)
 Addended by: TAWNI DRILLING D on: 04/17/2024 12:34 PM   Modules accepted: Orders

## 2024-04-17 NOTE — Progress Notes (Signed)
 [employer] Loop [employer]

## 2024-04-29 ENCOUNTER — Encounter

## 2024-05-01 ENCOUNTER — Ambulatory Visit

## 2024-05-01 DIAGNOSIS — I480 Paroxysmal atrial fibrillation: Secondary | ICD-10-CM | POA: Diagnosis not present

## 2024-05-04 LAB — CUP PACEART REMOTE DEVICE CHECK
Date Time Interrogation Session: 20250804112737
Implantable Pulse Generator Implant Date: 20231027
Pulse Gen Model: 450218
Pulse Gen Serial Number: 95049378

## 2024-05-07 ENCOUNTER — Ambulatory Visit: Payer: Self-pay | Admitting: Cardiology

## 2024-05-30 ENCOUNTER — Encounter

## 2024-06-01 ENCOUNTER — Ambulatory Visit

## 2024-06-01 DIAGNOSIS — I480 Paroxysmal atrial fibrillation: Secondary | ICD-10-CM

## 2024-06-01 LAB — CUP PACEART REMOTE DEVICE CHECK
Date Time Interrogation Session: 20250904115911
Implantable Pulse Generator Implant Date: 20231027
Pulse Gen Model: 450218
Pulse Gen Serial Number: 95049378

## 2024-06-04 ENCOUNTER — Ambulatory Visit: Payer: Self-pay | Admitting: Cardiology

## 2024-06-12 NOTE — Progress Notes (Signed)
 Remote Loop Recorder Transmission

## 2024-06-26 NOTE — Progress Notes (Signed)
 Remote Loop Recorder Transmission

## 2024-06-26 NOTE — Addendum Note (Signed)
 Addended by: VICCI SELLER A on: 06/26/2024 03:18 PM   Modules accepted: Orders

## 2024-06-30 ENCOUNTER — Encounter

## 2024-07-02 ENCOUNTER — Ambulatory Visit (INDEPENDENT_AMBULATORY_CARE_PROVIDER_SITE_OTHER)

## 2024-07-02 DIAGNOSIS — I480 Paroxysmal atrial fibrillation: Secondary | ICD-10-CM | POA: Diagnosis not present

## 2024-07-05 LAB — CUP PACEART REMOTE DEVICE CHECK
Date Time Interrogation Session: 20251006111445
Implantable Pulse Generator Implant Date: 20231027
Pulse Gen Model: 450218
Pulse Gen Serial Number: 95049378

## 2024-07-06 NOTE — Progress Notes (Signed)
 Remote Loop Recorder Transmission

## 2024-07-10 ENCOUNTER — Ambulatory Visit: Payer: Self-pay | Admitting: Cardiology

## 2024-07-31 ENCOUNTER — Encounter

## 2024-08-02 ENCOUNTER — Ambulatory Visit (INDEPENDENT_AMBULATORY_CARE_PROVIDER_SITE_OTHER)

## 2024-08-02 DIAGNOSIS — I480 Paroxysmal atrial fibrillation: Secondary | ICD-10-CM | POA: Diagnosis not present

## 2024-08-04 LAB — CUP PACEART REMOTE DEVICE CHECK
Date Time Interrogation Session: 20251106090659
Implantable Pulse Generator Implant Date: 20231027
Pulse Gen Model: 450218
Pulse Gen Serial Number: 95049378

## 2024-08-07 ENCOUNTER — Ambulatory Visit: Payer: Self-pay | Admitting: Cardiology

## 2024-08-08 NOTE — Progress Notes (Signed)
 Remote Loop Recorder Transmission

## 2024-08-31 ENCOUNTER — Encounter

## 2024-09-02 ENCOUNTER — Ambulatory Visit

## 2024-09-04 LAB — CUP PACEART REMOTE DEVICE CHECK
Date Time Interrogation Session: 20251206085449
Implantable Pulse Generator Implant Date: 20231027
Pulse Gen Model: 450218
Pulse Gen Serial Number: 95049378

## 2024-09-07 NOTE — Progress Notes (Signed)
 Remote Loop Recorder Transmission

## 2024-09-24 ENCOUNTER — Ambulatory Visit: Payer: Self-pay | Admitting: Cardiology

## 2024-10-01 ENCOUNTER — Encounter

## 2024-10-03 ENCOUNTER — Encounter

## 2024-10-04 ENCOUNTER — Ambulatory Visit: Attending: Family Medicine

## 2024-10-04 DIAGNOSIS — G459 Transient cerebral ischemic attack, unspecified: Secondary | ICD-10-CM

## 2024-10-09 NOTE — Progress Notes (Signed)
 Remote Loop Recorder Transmission

## 2024-11-04 ENCOUNTER — Ambulatory Visit

## 2024-12-05 ENCOUNTER — Ambulatory Visit

## 2025-01-05 ENCOUNTER — Ambulatory Visit
